# Patient Record
Sex: Female | Born: 1995 | Race: Black or African American | Hispanic: No | Marital: Single | Smoking: Former smoker
Health system: Southern US, Community
[De-identification: ages and names within clinical notes are randomized; demographics above are authoritative.]

## PROBLEM LIST (undated history)

## (undated) ENCOUNTER — Emergency Department (HOSPITAL_COMMUNITY): Admission: EM | Discharge status: Absent without leave | Payer: Medicaid Other

## (undated) DIAGNOSIS — I10 Essential (primary) hypertension: Secondary | ICD-10-CM

## (undated) HISTORY — PX: WISDOM TOOTH EXTRACTION: SHX21

---
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
## 2014-06-07 ENCOUNTER — Encounter (HOSPITAL_COMMUNITY): Payer: Self-pay | Admitting: Emergency Medicine

## 2014-06-07 ENCOUNTER — Emergency Department (HOSPITAL_COMMUNITY)
Admission: EM | Admit: 2014-06-07 | Discharge: 2014-06-07 | Disposition: A | Discharge status: Home / Self Care | Payer: Medicaid Other | Attending: Emergency Medicine | Admitting: Emergency Medicine

## 2014-06-07 DIAGNOSIS — R1011 Right upper quadrant pain: Secondary | ICD-10-CM | POA: Diagnosis not present

## 2014-06-07 DIAGNOSIS — R1013 Epigastric pain: Secondary | ICD-10-CM | POA: Diagnosis not present

## 2014-06-07 DIAGNOSIS — M79609 Pain in unspecified limb: Secondary | ICD-10-CM | POA: Diagnosis not present

## 2014-06-07 DIAGNOSIS — Z880 Allergy status to penicillin: Secondary | ICD-10-CM | POA: Diagnosis not present

## 2014-06-07 DIAGNOSIS — Z3202 Encounter for pregnancy test, result negative: Secondary | ICD-10-CM | POA: Insufficient documentation

## 2014-06-07 DIAGNOSIS — M549 Dorsalgia, unspecified: Secondary | ICD-10-CM | POA: Insufficient documentation

## 2014-06-07 LAB — URINALYSIS, ROUTINE W REFLEX MICROSCOPIC
BILIRUBIN URINE: NEGATIVE
Glucose, UA: NEGATIVE mg/dL
Ketones, ur: NEGATIVE mg/dL
Leukocytes, UA: NEGATIVE
Nitrite: NEGATIVE
PH: 6 (ref 5.0–8.0)
Protein, ur: NEGATIVE mg/dL
Specific Gravity, Urine: 1.021 (ref 1.005–1.030)
UROBILINOGEN UA: 0.2 mg/dL (ref 0.0–1.0)

## 2014-06-07 LAB — COMPREHENSIVE METABOLIC PANEL
ALT: 11 U/L (ref 0–35)
AST: 17 U/L (ref 0–37)
Albumin: 3.8 g/dL (ref 3.5–5.2)
Alkaline Phosphatase: 59 U/L (ref 39–117)
Anion gap: 13 (ref 5–15)
BUN: 11 mg/dL (ref 6–23)
CHLORIDE: 102 meq/L (ref 96–112)
CO2: 25 meq/L (ref 19–32)
CREATININE: 0.82 mg/dL (ref 0.50–1.10)
Calcium: 9.2 mg/dL (ref 8.4–10.5)
GFR calc Af Amer: >90 mL/min (ref >90)
GFR calc non Af Amer: >90 mL/min (ref >90)
Glucose, Bld: 92 mg/dL (ref 70–99)
Potassium: 3.6 mEq/L — ABNORMAL LOW (ref 3.7–5.3)
Sodium: 140 mEq/L (ref 137–147)
Total Protein: 7 g/dL (ref 6.0–8.3)

## 2014-06-07 LAB — CBC WITH DIFFERENTIAL/PLATELET
Basophils Absolute: 0 10*3/uL (ref 0.0–0.1)
Basophils Relative: 0 % (ref 0–1)
Eosinophils Absolute: 0.1 10*3/uL (ref 0.0–0.7)
Eosinophils Relative: 2 % (ref 0–5)
HEMATOCRIT: 36.7 % (ref 36.0–46.0)
HEMOGLOBIN: 12.2 g/dL (ref 12.0–15.0)
LYMPHS ABS: 2.4 10*3/uL (ref 0.7–4.0)
LYMPHS PCT: 34 % (ref 12–46)
MCH: 28.2 pg (ref 26.0–34.0)
MCHC: 33.2 g/dL (ref 30.0–36.0)
MCV: 84.8 fL (ref 78.0–100.0)
MONO ABS: 0.4 10*3/uL (ref 0.1–1.0)
Monocytes Relative: 5 % (ref 3–12)
Neutro Abs: 4.1 10*3/uL (ref 1.7–7.7)
Neutrophils Relative %: 59 % (ref 43–77)
Platelets: 286 10*3/uL (ref 150–400)
RBC: 4.33 MIL/uL (ref 3.87–5.11)
RDW: 12.4 % (ref 11.5–15.5)
WBC: 7 10*3/uL (ref 4.0–10.5)

## 2014-06-07 LAB — URINE MICROSCOPIC-ADD ON

## 2014-06-07 LAB — POC URINE PREG, ED: Preg Test, Ur: NEGATIVE

## 2014-06-07 LAB — LIPASE, BLOOD: LIPASE: 32 U/L (ref 11–59)

## 2014-06-07 MED ORDER — OMEPRAZOLE 20 MG PO CPDR: 20.0000 mg | DELAYED_RELEASE_CAPSULE | Freq: Every day | ORAL | Status: DC

## 2014-06-07 MED ORDER — GI COCKTAIL ~~LOC~~
30.0000 mL | Freq: Once | ORAL | Status: AC
Start: 2014-06-07 — End: 2014-06-07
  Administered 2014-06-07: 30 mL via ORAL
  Filled 2014-06-07: qty 30

## 2014-06-07 NOTE — ED Notes (Signed)
MD at bedside. 

## 2014-06-07 NOTE — ED Provider Notes (Signed)
CSN: [id number]     Arrival date & time 06/07/14  1649 History   First MD Initiated Contact with Patient 06/07/14 1914     Chief Complaint  Patient presents with  . Back Pain  . Arm Pain  . Leg Pain     Patient is a 18 y.o. female presenting with back pain, arm pain, leg pain, and abdominal pain. The history is provided by the patient.  Back Pain Associated symptoms: leg pain   Associated symptoms: no abdominal pain, no chest pain and no fever   Arm Pain Pertinent negatives include no abdominal pain, chest pain, chills, coughing, fever, nausea, rash or vomiting.  Leg Pain Associated symptoms: back pain   Associated symptoms: no fever   Abdominal Pain Pain location:  Epigastric Pain quality: stabbing   Pain radiates to:  Back Pain severity:  Moderate Onset quality:  Gradual Context: eating   Context: not alcohol use, not previous surgeries and not trauma   Associated symptoms: no chest pain, no chills, no cough, no fever, no nausea, no shortness of breath and no vomiting   Risk factors: no alcohol abuse, no aspirin use, has not had multiple surgeries, no NSAID use and not pregnant    Asymptomatic currently w/ exception of abd pain.      History reviewed. No pertinent past medical history. Past Surgical History  Procedure Laterality Date  . Wisdom tooth extraction     History reviewed. No pertinent family history. History  Substance Use Topics  . Smoking status: Never Smoker   . Smokeless tobacco: Not on file  . Alcohol Use: No   OB History   Grav Para Term Preterm Abortions TAB SAB Ect Mult Living                 Review of Systems  Constitutional: Negative for fever and chills.  Respiratory: Negative for cough, shortness of breath and wheezing.   Cardiovascular: Negative for chest pain.  Gastrointestinal: Negative for nausea, vomiting and abdominal pain.  Musculoskeletal: Positive for back pain.  Skin: Negative for rash.  All other systems reviewed and are  negative.     Allergies  Penicillins  Home Medications   Prior to Admission medications   Medication Sig Start Date End Date Taking? Authorizing Provider  ibuprofen (ADVIL,MOTRIN) 800 MG tablet Take 800 mg by mouth every 8 (eight) hours as needed for mild pain.   Yes Historical Provider, MD   BP 130/77  Pulse 89  Temp(Src) 98 F (36.7 C) (Oral)  Resp 12  Ht  (1.549 m)  SpO2 100% Physical Exam  Nursing note and vitals reviewed. Constitutional: She is oriented to person, place, and time. She appears well-developed and well-nourished. No distress.  HENT:  Head: Normocephalic and atraumatic.  Nose: Nose normal.  Mouth/Throat: Oropharynx is clear and moist. No oropharyngeal exudate.  Eyes: Conjunctivae are normal. Pupils are equal, round, and reactive to light. No scleral icterus.  Neck: Normal range of motion. Neck supple. No tracheal deviation present.  Cardiovascular: Normal rate, regular rhythm and normal heart sounds.   No murmur heard. Pulmonary/Chest: Effort normal and breath sounds normal. No respiratory distress. She has no wheezes. She has no rales. She exhibits no tenderness.  Abdominal: Soft. Bowel sounds are normal. She exhibits no distension and no mass. There is tenderness (epigastric and RUQ). There is no rebound and no guarding.  Neg Murphy's. Neg CVAT  Musculoskeletal: Normal range of motion. She exhibits no edema.  No edema, warmth,  or TTP of extremities  Neurological: She is alert and oriented to person, place, and time. No cranial nerve deficit. Coordination normal.  Normal strength and sensation. Reflexes equal 2+. Normal gait  Skin: Skin is warm and dry. No rash noted.  Psychiatric: She has a normal mood and affect.    ED Course  Procedures (including critical care time) Labs Review Labs Reviewed  COMPREHENSIVE METABOLIC PANEL - Abnormal; Notable for the following:    Potassium 3.6 (*)    Total Bilirubin <0.2 (*)    All other components within  normal limits  URINALYSIS, ROUTINE W REFLEX MICROSCOPIC - Abnormal; Notable for the following:    APPearance CLOUDY (*)    Hgb urine dipstick TRACE (*)    All other components within normal limits  URINE MICROSCOPIC-ADD ON - Abnormal; Notable for the following:    Squamous Epithelial / LPF MANY (*)    Bacteria, UA FEW (*)    All other components within normal limits  CBC WITH DIFFERENTIAL  LIPASE, BLOOD  POC URINE PREG, ED    Imaging Review No results found.   EKG Interpretation None      MDM   Final diagnoses:  Epigastric pain    Pt presents for multiple complaints and is pan positive on review of systems making. Pt notes intermittent pain to left side of back, arm and leg but has been asymptomatic recently. Etiology not clear. No obvious source of pain on exam. No signs of DVT or infections.   Pt presented to ED primarily for epigastric pain worse with meals.  Neg Murphy sign and pain is mostly epigastric not RUQ.  Abd soft NDNT, no signs of peritonitis or systemic toxicity to suggest surgical emergency. Suspect gastritis vs PUD vs GERD.  No bloody stool per pt.  Tolerates PO.  Gave GI cocktail.  Doubt pancreatitis. Pt is well appearing.   UA w./o infx markers. Preg neg,    8:35 PM abd pain improved after GI cocktail  Suspect GI source of pain. D/c with PPI.  Gave f/u contact info to arrange PCP appt.    Sofie Rower, MD 06/08/14 [phone]

## 2014-06-07 NOTE — ED Notes (Signed)
Pt A&Ox4, ambulatory at d/c with steady gait, NAD 

## 2014-06-07 NOTE — ED Notes (Signed)
Pt c/o left arm, leg and back pain intermittently x months; pt sts some numbness at times

## 2014-06-09 NOTE — ED Provider Notes (Signed)
I have personally seen and examined the patient.  I have discussed the plan of care with the resident.  I have reviewed the documentation on PMH/FH/Soc. History.  I have reviewed the documentation of the resident and agree.  Pt well appearing on my evaluation abd soft without any significant tenderness I feel she is safe/stable for d/c home and followup as outpatient   Joya Gaskins, MD 06/09/14 0025

## 2014-09-28 NOTE — L&D Delivery Note (Signed)
    Delivery Note At 3:53 AM a viable female was delivered via  (Presentation: left occiput anterior  ).  APGAR: 4, 9; weight 4 lb 2.3 oz (1880 g).   Placenta status: spontaneous, intact.  Cord: 3 vessels  with the following complications: none .  Cord pH: N/A  Anesthesia: Epidural  Episiotomy: None Lacerations: None Suture Repair: none Est. Blood Loss (mL): 100  Mom to postpartum.  Baby to Couplet care / Skin to Skin. Due to baby's small size, Peds having baby feed and will check CBG. If baby can't maintain blood sugar, baby to NICU.   De Hollingshead 04/23/2015, 4:24 AM  I was gloved and participated in the delivery Agree with note No difficulty with shoulders Baby was slow to cry after delivery, probably due to IUGR RNs were in contact with Peds MD re: management Aviva Signs, CNM

## 2014-10-22 ENCOUNTER — Other Ambulatory Visit: Payer: Self-pay | Admitting: Obstetrics and Gynecology

## 2014-10-22 DIAGNOSIS — O3680X0 Pregnancy with inconclusive fetal viability, not applicable or unspecified: Secondary | ICD-10-CM

## 2014-10-24 ENCOUNTER — Other Ambulatory Visit: Payer: Self-pay | Admitting: Obstetrics and Gynecology

## 2014-10-24 ENCOUNTER — Ambulatory Visit (INDEPENDENT_AMBULATORY_CARE_PROVIDER_SITE_OTHER): Payer: Medicaid Other

## 2014-10-24 DIAGNOSIS — O26841 Uterine size-date discrepancy, first trimester: Secondary | ICD-10-CM

## 2014-10-24 DIAGNOSIS — O3680X0 Pregnancy with inconclusive fetal viability, not applicable or unspecified: Secondary | ICD-10-CM

## 2014-10-24 NOTE — Progress Notes (Signed)
U/S-single IUP with +FCA noted, FHR-157 bpm, cx appears closed, anterior Gr 0 placenta, bilateral adnexa appears WNL, pt desires NT/IT screening, it will be scheduled with New OB appt

## 2014-10-30 ENCOUNTER — Other Ambulatory Visit: Payer: Self-pay | Admitting: Obstetrics and Gynecology

## 2014-10-30 DIAGNOSIS — Z3682 Encounter for antenatal screening for nuchal translucency: Secondary | ICD-10-CM

## 2014-11-06 ENCOUNTER — Ambulatory Visit (INDEPENDENT_AMBULATORY_CARE_PROVIDER_SITE_OTHER): Payer: Medicaid Other | Admitting: Women's Health

## 2014-11-06 ENCOUNTER — Other Ambulatory Visit: Payer: Medicaid Other

## 2014-11-06 ENCOUNTER — Ambulatory Visit (INDEPENDENT_AMBULATORY_CARE_PROVIDER_SITE_OTHER): Payer: Medicaid Other

## 2014-11-06 ENCOUNTER — Encounter: Payer: Self-pay | Admitting: Women's Health

## 2014-11-06 VITALS — BP 110/60 | Wt 175.0 lb

## 2014-11-06 DIAGNOSIS — Z0283 Encounter for blood-alcohol and blood-drug test: Secondary | ICD-10-CM

## 2014-11-06 DIAGNOSIS — Z130 Encounter for screening for diseases of the blood and blood-forming organs and certain disorders involving the immune mechanism: Secondary | ICD-10-CM

## 2014-11-06 DIAGNOSIS — Z1389 Encounter for screening for other disorder: Secondary | ICD-10-CM

## 2014-11-06 DIAGNOSIS — Z3400 Encounter for supervision of normal first pregnancy, unspecified trimester: Secondary | ICD-10-CM | POA: Insufficient documentation

## 2014-11-06 DIAGNOSIS — Z3401 Encounter for supervision of normal first pregnancy, first trimester: Secondary | ICD-10-CM

## 2014-11-06 DIAGNOSIS — Z3402 Encounter for supervision of normal first pregnancy, second trimester: Secondary | ICD-10-CM

## 2014-11-06 DIAGNOSIS — Z114 Encounter for screening for human immunodeficiency virus [HIV]: Secondary | ICD-10-CM

## 2014-11-06 DIAGNOSIS — Z118 Encounter for screening for other infectious and parasitic diseases: Secondary | ICD-10-CM

## 2014-11-06 DIAGNOSIS — Z3682 Encounter for antenatal screening for nuchal translucency: Secondary | ICD-10-CM

## 2014-11-06 DIAGNOSIS — Z0184 Encounter for antibody response examination: Secondary | ICD-10-CM

## 2014-11-06 DIAGNOSIS — Z113 Encounter for screening for infections with a predominantly sexual mode of transmission: Secondary | ICD-10-CM

## 2014-11-06 DIAGNOSIS — Z1371 Encounter for nonprocreative screening for genetic disease carrier status: Secondary | ICD-10-CM

## 2014-11-06 DIAGNOSIS — Z331 Pregnant state, incidental: Secondary | ICD-10-CM

## 2014-11-06 DIAGNOSIS — Z1159 Encounter for screening for other viral diseases: Secondary | ICD-10-CM

## 2014-11-06 DIAGNOSIS — Z36 Encounter for antenatal screening of mother: Secondary | ICD-10-CM

## 2014-11-06 LAB — POCT URINALYSIS DIPSTICK
Blood, UA: NEGATIVE
Glucose, UA: NEGATIVE
KETONES UA: NEGATIVE
Leukocytes, UA: NEGATIVE
Nitrite, UA: NEGATIVE
PROTEIN UA: NEGATIVE

## 2014-11-06 LAB — OB RESULTS CONSOLE ABO/RH: RH Type: POSITIVE

## 2014-11-06 LAB — OB RESULTS CONSOLE RUBELLA ANTIBODY, IGM: Rubella: IMMUNE

## 2014-11-06 NOTE — Progress Notes (Signed)
U/S(13+3wks)-active fetus, meas c/w dates, fluid WNL, anterior Gr 0 placenta, cx appears closed (3.3cm), bilateral adnexa appears WNL, NB present, NT-1.3459mm, FHr- 159 bpm

## 2014-11-06 NOTE — Progress Notes (Signed)
  Subjective:  Christina Ingram is a 19 y.o. G1P0 African American female at 4942w3d by 11wk u/s, being seen today for her first obstetrical visit.  Her obstetrical history is significant for primigravida.  She states she was born w/ arthritis, was taking an otc arthritis med but stopped when found out she was pregnant. Now eats bananas to help w/ her pain. Pregnancy history fully reviewed.  Patient reports no complaints. Denies vb, cramping, uti s/s, abnormal/malodorous vag d/c, or vulvovaginal itching/irritation.  BP 110/60 mmHg  Wt 175 lb (79.379 kg)  LMP 07/31/2014 (Approximate)  HISTORY: OB History  Gravida Para Term Preterm AB SAB TAB Ectopic Multiple Living  1             # Outcome Date GA Lbr Len/2nd Weight Sex Delivery Anes PTL Lv  1 Current              History reviewed. No pertinent past medical history. Past Surgical History  Procedure Laterality Date  . Wisdom tooth extraction     No family history on file.  Exam   System:     General: Well developed & nourished, no acute distress   Skin: Warm & dry, normal coloration and turgor, no rashes   Neurologic: Alert & oriented, normal mood   Cardiovascular: Regular rate & rhythm   Respiratory: Effort & rate normal, LCTAB, acyanotic   Abdomen: Soft, non tender   Extremities: normal strength, tone  Thin prep pap smear <21yo high risk HPV cotesting FHR: 159 via u/s   Assessment:   Pregnancy: G1P0 There are no active problems to display for this patient.   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   Plan:  Initial labs drawn Continue prenatal vitamins Problem list reviewed and updated Reviewed n/v relief measures and warning s/s to report Reviewed recommended weight gain based on pre-gravid BMI Encouraged well-balanced diet Genetic Screening discussed Integrated Screen: 1st IT/NT today Cystic fibrosis screening discussed requested Ultrasound discussed; fetal survey: requested Follow up in 4 weeks for  visit and 2nd IT CCNC completed NFPartnership offered, accepted, referral faxed  Had flu shot Dec @ [facility]  Christina DuncansBooker, Christina Ingram CNM, [facility]BC 11/06/2014 10:53 AM

## 2014-11-06 NOTE — Patient Instructions (Signed)

## 2014-11-07 LAB — SICKLE CELL SCREEN: SICKLE CELL SCREEN: NEGATIVE

## 2014-11-07 LAB — URINE CULTURE: Organism ID, Bacteria: NO GROWTH

## 2014-11-08 ENCOUNTER — Telehealth: Payer: Self-pay | Admitting: *Deleted

## 2014-11-08 DIAGNOSIS — Z3402 Encounter for supervision of normal first pregnancy, second trimester: Secondary | ICD-10-CM

## 2014-11-08 LAB — MATERNAL SCREEN, INTEGRATED #1
Crown Rump Length: 66.7 mm
Gest. Age on Collection Date: 12.9 weeks
Maternal Age at EDD: 21 years
NUCHAL TRANSLUCENCY (NT): 1.6 mm
NUMBER OF FETUSES: 1
PAPP-A VALUE: 705.8 ng/mL
WEIGHT: 175 [lb_av]

## 2014-11-08 LAB — GC/CHLAMYDIA PROBE AMP
Chlamydia trachomatis, NAA: NEGATIVE
NEISSERIA GONORRHOEAE BY PCR: NEGATIVE

## 2014-11-08 MED ORDER — PRENATAL PLUS 27-1 MG PO TABS: 1.0000 | ORAL_TABLET | Freq: Every day | ORAL | Status: DC

## 2014-11-08 NOTE — Telephone Encounter (Signed)
Pt called stating that she needed a refill on her prenatal vitamins. [facility] [place] I advised the pt to give the pharmacy till after lunch to receive the Rx and fill it. Pt verbalized understanding.

## 2014-11-12 LAB — PMP SCREEN PROFILE (10S), URINE
Amphetamine Screen, Ur: NEGATIVE ng/mL
BENZODIAZEPINE SCREEN, URINE: NEGATIVE ng/mL
Barbiturate Screen, Ur: NEGATIVE ng/mL
Cannabinoids Ur Ql Scn: NEGATIVE ng/mL
Cocaine(Metab.)Screen, Urine: NEGATIVE ng/mL
Creatinine(Crt), U: 198.5 mg/dL (ref 20.0–300.0)
Methadone Scn, Ur: NEGATIVE ng/mL
OXYCODONE+OXYMORPHONE UR QL SCN: NEGATIVE ng/mL
Opiate Scrn, Ur: NEGATIVE ng/mL
PCP Scrn, Ur: NEGATIVE ng/mL
PROPOXYPHENE SCREEN: NEGATIVE ng/mL
Ph of Urine: 5.9 (ref 4.5–8.9)

## 2014-11-12 LAB — CBC
HCT: 37.1 % (ref 34.0–46.6)
Hemoglobin: 12.6 g/dL (ref 11.1–15.9)
MCH: 29 pg (ref 26.6–33.0)
MCHC: 34 g/dL (ref 31.5–35.7)
MCV: 86 fL (ref 79–97)
Platelets: 247 10*3/uL (ref 150–379)
RBC: 4.34 x10E6/uL (ref 3.77–5.28)
RDW: 13.7 % (ref 12.3–15.4)
WBC: 7.1 10*3/uL (ref 3.4–10.8)

## 2014-11-12 LAB — HEPATITIS B SURFACE ANTIGEN: Hepatitis B Surface Ag: NEGATIVE

## 2014-11-12 LAB — URINALYSIS, ROUTINE W REFLEX MICROSCOPIC
BILIRUBIN UA: NEGATIVE
GLUCOSE, UA: NEGATIVE
KETONES UA: NEGATIVE
Leukocytes, UA: NEGATIVE
Nitrite, UA: NEGATIVE
PH UA: 6 (ref 5.0–7.5)
PROTEIN UA: NEGATIVE
RBC, UA: NEGATIVE
SPEC GRAV UA: 1.027 (ref 1.005–1.030)
Urobilinogen, Ur: 0.2 mg/dL (ref 0.2–1.0)

## 2014-11-12 LAB — CYSTIC FIBROSIS MUTATION 97: GENE DIS ANAL CARRIER INTERP BLD/T-IMP: NOT DETECTED

## 2014-11-12 LAB — RPR: RPR Ser Ql: NONREACTIVE

## 2014-11-12 LAB — ANTIBODY SCREEN: ANTIBODY SCREEN: NEGATIVE

## 2014-11-12 LAB — ABO/RH: Rh Factor: POSITIVE

## 2014-11-12 LAB — RUBELLA SCREEN: RUBELLA: 4.71 {index} (ref >0.99)

## 2014-11-12 LAB — VARICELLA ZOSTER ANTIBODY, IGG: VARICELLA: 3053 {index} (ref >165)

## 2014-11-12 LAB — HIV ANTIBODY (ROUTINE TESTING W REFLEX): HIV SCREEN 4TH GENERATION: NONREACTIVE

## 2014-12-04 ENCOUNTER — Ambulatory Visit (INDEPENDENT_AMBULATORY_CARE_PROVIDER_SITE_OTHER): Payer: Medicaid Other | Admitting: Women's Health

## 2014-12-04 ENCOUNTER — Encounter: Payer: Self-pay | Admitting: Women's Health

## 2014-12-04 VITALS — BP 116/66 | HR 72 | Wt 174.0 lb

## 2014-12-04 DIAGNOSIS — Z3682 Encounter for antenatal screening for nuchal translucency: Secondary | ICD-10-CM

## 2014-12-04 DIAGNOSIS — Z363 Encounter for antenatal screening for malformations: Secondary | ICD-10-CM

## 2014-12-04 DIAGNOSIS — Z3402 Encounter for supervision of normal first pregnancy, second trimester: Secondary | ICD-10-CM

## 2014-12-04 DIAGNOSIS — Z331 Pregnant state, incidental: Secondary | ICD-10-CM

## 2014-12-04 DIAGNOSIS — Z1389 Encounter for screening for other disorder: Secondary | ICD-10-CM

## 2014-12-04 LAB — POCT URINALYSIS DIPSTICK
Blood, UA: NEGATIVE
Glucose, UA: NEGATIVE
KETONES UA: NEGATIVE
LEUKOCYTES UA: NEGATIVE
Nitrite, UA: NEGATIVE
PROTEIN UA: NEGATIVE

## 2014-12-04 NOTE — Patient Instructions (Signed)
Second Trimester of Pregnancy The second trimester is from week 13 through week 28, months 4 through 6. The second trimester is often a time when you feel your best. Your body has also adjusted to being pregnant, and you begin to feel better physically. Usually, morning sickness has lessened or quit completely, you may have more energy, and you may have an increase in appetite. The second trimester is also a time when the fetus is growing rapidly. At the end of the sixth month, the fetus is about 9 inches long and weighs about 1 pounds. You will likely begin to feel the baby move (quickening) between 18 and 20 weeks of the pregnancy. BODY CHANGES Your body goes through many changes during pregnancy. The changes vary from woman to woman.   Your weight will continue to increase. You will notice your lower abdomen bulging out.  You may begin to get stretch marks on your hips, abdomen, and breasts.  You may develop headaches that can be relieved by medicines approved by your health care provider.  You may urinate more often because the fetus is pressing on your bladder.  You may develop or continue to have heartburn as a result of your pregnancy.  You may develop constipation because certain hormones are causing the muscles that push waste through your intestines to slow down.  You may develop hemorrhoids or swollen, bulging veins (varicose veins).  You may have back pain because of the weight gain and pregnancy hormones relaxing your joints between the bones in your pelvis and as a result of a shift in weight and the muscles that support your balance.  Your breasts will continue to grow and be tender.  Your gums may bleed and may be sensitive to brushing and flossing.  Dark spots or blotches (chloasma, mask of pregnancy) may develop on your face. This will likely fade after the baby is born.  A dark line from your belly button to the pubic area (linea nigra) may appear. This will likely fade  after the baby is born.  You may have changes in your hair. These can include thickening of your hair, rapid growth, and changes in texture. Some women also have hair loss during or after pregnancy, or hair that feels dry or thin. Your hair will most likely return to normal after your baby is born. WHAT TO EXPECT AT YOUR PRENATAL VISITS During a routine prenatal visit:  You will be weighed to make sure you and the fetus are growing normally.  Your blood pressure will be taken.  Your abdomen will be measured to track your baby's growth.  The fetal heartbeat will be listened to.  Any test results from the previous visit will be discussed. Your health care provider may ask you:  How you are feeling.  If you are feeling the baby move.  If you have had any abnormal symptoms, such as leaking fluid, bleeding, severe headaches, or abdominal cramping.  If you have any questions. Other tests that may be performed during your second trimester include:  Blood tests that check for:  Low iron levels (anemia).  Gestational diabetes (between 24 and 28 weeks).  Rh antibodies.  Urine tests to check for infections, diabetes, or protein in the urine.  An ultrasound to confirm the proper growth and development of the baby.  An amniocentesis to check for possible genetic problems.  Fetal screens for spina bifida and Down syndrome. HOME CARE INSTRUCTIONS   Avoid all smoking, herbs, alcohol, and unprescribed   drugs. These chemicals affect the formation and growth of the baby.  Follow your health care provider's instructions regarding medicine use. There are medicines that are either safe or unsafe to take during pregnancy.  Exercise only as directed by your health care provider. Experiencing uterine cramps is a good sign to stop exercising.  Continue to eat regular, healthy meals.  Wear a good support bra for breast tenderness.  Do not use hot tubs, steam rooms, or saunas.  Wear your  seat belt at all times when driving.  Avoid raw meat, uncooked cheese, cat litter boxes, and soil used by cats. These carry germs that can cause birth defects in the baby.  Take your prenatal vitamins.  Try taking a stool softener (if your health care provider approves) if you develop constipation. Eat more high-fiber foods, such as fresh vegetables or fruit and whole grains. Drink plenty of fluids to keep your urine clear or pale yellow.  Take warm sitz baths to soothe any pain or discomfort caused by hemorrhoids. Use hemorrhoid cream if your health care provider approves.  If you develop varicose veins, wear support hose. Elevate your feet for 15 minutes, 3-4 times a day. Limit salt in your diet.  Avoid heavy lifting, wear low heel shoes, and practice good posture.  Rest with your legs elevated if you have leg cramps or low back pain.  Visit your dentist if you have not gone yet during your pregnancy. Use a soft toothbrush to brush your teeth and be gentle when you floss.  A sexual relationship may be continued unless your health care provider directs you otherwise.  Continue to go to all your prenatal visits as directed by your health care provider. SEEK MEDICAL CARE IF:   You have dizziness.  You have mild pelvic cramps, pelvic pressure, or nagging pain in the abdominal area.  You have persistent nausea, vomiting, or diarrhea.  You have a bad smelling vaginal discharge.  You have pain with urination. SEEK IMMEDIATE MEDICAL CARE IF:   You have a fever.  You are leaking fluid from your vagina.  You have spotting or bleeding from your vagina.  You have severe abdominal cramping or pain.  You have rapid weight gain or loss.  You have shortness of breath with chest pain.  You notice sudden or extreme swelling of your face, hands, ankles, feet, or legs.  You have not felt your baby move in over an hour.  You have severe headaches that do not go away with  medicine.  You have vision changes. Document Released: 09/08/2001 Document Revised: 09/19/2013 Document Reviewed: 11/15/2012 [employer] Patient Information 2015 [employer]. This information is not intended to replace advice given to you by your health care provider. Make sure you discuss any questions you have with your health care provider.  

## 2014-12-04 NOTE — Progress Notes (Signed)
Low-risk OB appointment G1P0 668w3d Estimated Date of Delivery: 05/11/15 BP 116/66 mmHg  Pulse 72  Wt 174 lb (78.926 kg)  LMP 07/31/2014 (Approximate)  BP, weight reviewed.  Unable to void, will try again before she leaves. Refer to obstetrical flow sheet for FH & FHR.  Some fm. Denies cramping, lof, vb, or uti s/s. No complaints. Reviewed warning s/s to report, fm. Plan:  Continue routine obstetrical care  F/U in 3wks for OB appointment and anatomy u/s. 2nd IT today

## 2014-12-06 LAB — MATERNAL SCREEN, INTEGRATED #2
AFP MoM: 1.19
Alpha-Fetoprotein: 41.5 ng/mL
Crown Rump Length: 66.7 mm
DIA MOM: 2.62
DIA Value: 428 pg/mL
Estriol, Unconjugated: 0.53 ng/mL
GEST. AGE ON COLLECTION DATE: 12.9 wk
GESTATIONAL AGE: 16.9 wk
MATERNAL AGE AT EDD: 21 a
NUCHAL TRANSLUCENCY (NT): 1.6 mm
NUCHAL TRANSLUCENCY MOM: 1.12
NUMBER OF FETUSES: 1
PAPP-A MOM: 0.74
PAPP-A VALUE: 705.8 ng/mL
TEST RESULTS: NEGATIVE
WEIGHT: 175 [lb_av]
Weight: 174 [lb_av]
hCG MoM: 1.74
hCG Value: 46.1 IU/mL
uE3 MoM: 0.54

## 2014-12-25 ENCOUNTER — Ambulatory Visit (INDEPENDENT_AMBULATORY_CARE_PROVIDER_SITE_OTHER): Payer: Medicaid Other

## 2014-12-25 ENCOUNTER — Encounter: Payer: Self-pay | Admitting: Obstetrics & Gynecology

## 2014-12-25 ENCOUNTER — Ambulatory Visit (INDEPENDENT_AMBULATORY_CARE_PROVIDER_SITE_OTHER): Payer: Medicaid Other | Admitting: Obstetrics & Gynecology

## 2014-12-25 VITALS — BP 120/80 | HR 80 | Wt 176.0 lb

## 2014-12-25 DIAGNOSIS — Z1389 Encounter for screening for other disorder: Secondary | ICD-10-CM

## 2014-12-25 DIAGNOSIS — Z36 Encounter for antenatal screening of mother: Secondary | ICD-10-CM | POA: Diagnosis not present

## 2014-12-25 DIAGNOSIS — Z3402 Encounter for supervision of normal first pregnancy, second trimester: Secondary | ICD-10-CM

## 2014-12-25 DIAGNOSIS — Z331 Pregnant state, incidental: Secondary | ICD-10-CM

## 2014-12-25 DIAGNOSIS — Z363 Encounter for antenatal screening for malformations: Secondary | ICD-10-CM

## 2014-12-25 NOTE — Progress Notes (Signed)
US 6239w3d ,measurements c/w dates,cx appears closed 4.1cm,afi wnl,ant fundal pl,breech pos,fht 159,normal ov's, **limited view of heart,pleases schedule for f/u**

## 2014-12-25 NOTE — Progress Notes (Signed)
Sonogram is normal sub optimal views of OFT: repeat at 28 weeks  G1P0 127w3d Estimated Date of Delivery: 05/11/15  Weight 176 lb (79.833 kg), last menstrual period 07/31/2014.   BP weight and urine results all reviewed and noted.  Please refer to the obstetrical flow sheet for the fundal height and fetal heart rate documentation:  Patient reports good fetal movement, denies any bleeding and no rupture of membranes symptoms or regular contractions. Patient is without complaints. All questions were answered.  Plan:  Continued routine obstetrical care,   Follow up in 4 weeks for OB appointment,

## 2014-12-26 ENCOUNTER — Ambulatory Visit (INDEPENDENT_AMBULATORY_CARE_PROVIDER_SITE_OTHER): Payer: Medicaid Other | Admitting: Obstetrics & Gynecology

## 2014-12-26 VITALS — BP 132/80 | HR 80 | Temp 98.1°F | Wt 178.0 lb

## 2014-12-26 DIAGNOSIS — J020 Streptococcal pharyngitis: Secondary | ICD-10-CM

## 2014-12-26 DIAGNOSIS — Z1389 Encounter for screening for other disorder: Secondary | ICD-10-CM

## 2014-12-26 DIAGNOSIS — Z331 Pregnant state, incidental: Secondary | ICD-10-CM

## 2014-12-26 LAB — POCT URINALYSIS DIPSTICK
GLUCOSE UA: NEGATIVE
Glucose, UA: NEGATIVE
Ketones, UA: NEGATIVE
LEUKOCYTES UA: NEGATIVE
NITRITE UA: NEGATIVE
Protein, UA: NEGATIVE
Protein, UA: NEGATIVE

## 2014-12-26 NOTE — Progress Notes (Signed)
[employer]  Pt complaining of sore throat for 1 day, dysphagia, hurts to talk no fever chills Stuffy cough  Blood pressure 132/80, pulse 80, temperature 98.1 F (36.7 C), weight 178 lb (80.74 kg), last menstrual period 07/31/2014. Bilateral lymphadenopathy tender, bilateral tonsillar microabscesses No sinus tenderness  Strep Pharyngitis  Keflex 500 mg QID x 10 days

## 2015-01-22 ENCOUNTER — Encounter: Payer: Self-pay | Admitting: Women's Health

## 2015-01-22 ENCOUNTER — Ambulatory Visit (INDEPENDENT_AMBULATORY_CARE_PROVIDER_SITE_OTHER): Payer: Medicaid Other | Admitting: Women's Health

## 2015-01-22 VITALS — BP 114/70 | HR 80 | Wt 180.0 lb

## 2015-01-22 DIAGNOSIS — Z3402 Encounter for supervision of normal first pregnancy, second trimester: Secondary | ICD-10-CM

## 2015-01-22 DIAGNOSIS — Z363 Encounter for antenatal screening for malformations: Secondary | ICD-10-CM

## 2015-01-22 DIAGNOSIS — Z1389 Encounter for screening for other disorder: Secondary | ICD-10-CM

## 2015-01-22 DIAGNOSIS — Z331 Pregnant state, incidental: Secondary | ICD-10-CM

## 2015-01-22 LAB — POCT URINALYSIS DIPSTICK
Blood, UA: NEGATIVE
GLUCOSE UA: NEGATIVE
Ketones, UA: NEGATIVE
LEUKOCYTES UA: NEGATIVE
NITRITE UA: NEGATIVE
Protein, UA: NEGATIVE

## 2015-01-22 NOTE — Patient Instructions (Addendum)
You will have your sugar test next visit.  Please do not eat or drink anything after midnight the night before you come, not even water.  You will be here for at least two hours.     Call the office [phone]) or go to [facility] if:  You begin to have strong, frequent contractions  Your water breaks.  Sometimes it is a big gush of fluid, sometimes it is just a trickle that keeps getting your panties wet or running down your legs  You have vaginal bleeding.  It is normal to have a small amount of spotting if your cervix was checked.   You don't feel your baby moving like normal.  If you don't, get you something to eat and drink and lay down and focus on feeling your baby move.   If your baby is still not moving like normal, you should call the office or go to [facility].  Constipation  Drink plenty of fluid, preferably water, throughout the day  Eat foods high in fiber such as fruits, vegetables, and grains  Exercise, such as walking, is a good way to keep your bowels regular  Drink warm fluids, especially warm prune juice, or decaf coffee  Eat a 1/2 cup of real oatmeal (not instant), 1/2 cup applesauce, and 1/2-1 cup warm prune juice every day  If needed, you may take Colace (docusate sodium) stool softener once or twice a day to help keep the stool soft. If you are pregnant, wait until you are out of your first trimester (12-14 weeks of pregnancy)  If you still are having problems with constipation, you may take Miralax once daily as needed to help keep your bowels regular.  If you are pregnant, wait until you are out of your first trimester (12-14 weeks of pregnancy)    Second Trimester of Pregnancy The second trimester is from week 13 through week 28, months 4 through 6. The second trimester is often a time when you feel your best. Your body has also adjusted to being pregnant, and you begin to feel better physically. Usually, morning sickness has lessened or quit  completely, you may have more energy, and you may have an increase in appetite. The second trimester is also a time when the fetus is growing rapidly. At the end of the sixth month, the fetus is about 9 inches long and weighs about 1 pounds. You will likely begin to feel the baby move (quickening) between 18 and 20 weeks of the pregnancy. BODY CHANGES Your body goes through many changes during pregnancy. The changes vary from woman to woman.  12. Your weight will continue to increase. You will notice your lower abdomen bulging out. 13. You may begin to get stretch marks on your hips, abdomen, and breasts. 14. You may develop headaches that can be relieved by medicines approved by your health care provider. 15. You may urinate more often because the fetus is pressing on your bladder. 16. You may develop or continue to have heartburn as a result of your pregnancy. 17. You may develop constipation because certain hormones are causing the muscles that push waste through your intestines to slow down. 18. You may develop hemorrhoids or swollen, bulging veins (varicose veins). 19. You may have back pain because of the weight gain and pregnancy hormones relaxing your joints between the bones in your pelvis and as a result of a shift in weight and the muscles that support your balance. 20. Your breasts will continue  to grow and be tender. 21. Your gums may bleed and may be sensitive to brushing and flossing. 22. Dark spots or blotches (chloasma, mask of pregnancy) may develop on your face. This will likely fade after the baby is born. 23. A dark line from your belly button to the pubic area (linea nigra) may appear. This will likely fade after the baby is born. 24. You may have changes in your hair. These can include thickening of your hair, rapid growth, and changes in texture. Some women also have hair loss during or after pregnancy, or hair that feels dry or thin. Your hair will most likely return to  normal after your baby is born. WHAT TO EXPECT AT YOUR PRENATAL VISITS During a routine prenatal visit:  You will be weighed to make sure you and the fetus are growing normally.  Your blood pressure will be taken.  Your abdomen will be measured to track your baby's growth.  The fetal heartbeat will be listened to.  Any test results from the previous visit will be discussed. Your health care provider may ask you:  How you are feeling.  If you are feeling the baby move.  If you have had any abnormal symptoms, such as leaking fluid, bleeding, severe headaches, or abdominal cramping.  If you have any questions. Other tests that may be performed during your second trimester include:  Blood tests that check for:  Low iron levels (anemia).  Gestational diabetes (between 24 and 28 weeks).  Rh antibodies.  Urine tests to check for infections, diabetes, or protein in the urine.  An ultrasound to confirm the proper growth and development of the baby.  An amniocentesis to check for possible genetic problems.  Fetal screens for spina bifida and Down syndrome. HOME CARE INSTRUCTIONS   Avoid all smoking, herbs, alcohol, and unprescribed drugs. These chemicals affect the formation and growth of the baby.  Follow your health care provider's instructions regarding medicine use. There are medicines that are either safe or unsafe to take during pregnancy.  Exercise only as directed by your health care provider. Experiencing uterine cramps is a good sign to stop exercising.  Continue to eat regular, healthy meals.  Wear a good support bra for breast tenderness.  Do not use hot tubs, steam rooms, or saunas.  Wear your seat belt at all times when driving.  Avoid raw meat, uncooked cheese, cat litter boxes, and soil used by cats. These carry germs that can cause birth defects in the baby.  Take your prenatal vitamins.  Try taking a stool softener (if your health care provider  approves) if you develop constipation. Eat more high-fiber foods, such as fresh vegetables or fruit and whole grains. Drink plenty of fluids to keep your urine clear or pale yellow.  Take warm sitz baths to soothe any pain or discomfort caused by hemorrhoids. Use hemorrhoid cream if your health care provider approves.  If you develop varicose veins, wear support hose. Elevate your feet for 15 minutes, 3-4 times a day. Limit salt in your diet.  Avoid heavy lifting, wear low heel shoes, and practice good posture.  Rest with your legs elevated if you have leg cramps or low back pain.  Visit your dentist if you have not gone yet during your pregnancy. Use a soft toothbrush to brush your teeth and be gentle when you floss.  A sexual relationship may be continued unless your health care provider directs you otherwise.  Continue to go to all your  prenatal visits as directed by your health care provider. SEEK MEDICAL CARE IF:   You have dizziness.  You have mild pelvic cramps, pelvic pressure, or nagging pain in the abdominal area.  You have persistent nausea, vomiting, or diarrhea.  You have a bad smelling vaginal discharge.  You have pain with urination. SEEK IMMEDIATE MEDICAL CARE IF:   You have a fever.  You are leaking fluid from your vagina.  You have spotting or bleeding from your vagina.  You have severe abdominal cramping or pain.  You have rapid weight gain or loss.  You have shortness of breath with chest pain.  You notice sudden or extreme swelling of your face, hands, ankles, feet, or legs.  You have not felt your baby move in over an hour.  You have severe headaches that do not go away with medicine.  You have vision changes. Document Released: 09/08/2001 Document Revised: 09/19/2013 Document Reviewed: 11/15/2012 [facility] Patient Information 2015 [place], [place]. This information is not intended to replace advice given to you by your health care provider. Make  sure you discuss any questions you have with your health care provider.

## 2015-01-22 NOTE — Progress Notes (Signed)
Low-risk OB appointment G1P0 7672w3d Estimated Date of Delivery: 05/11/15 BP 114/70 mmHg  Pulse 80  Wt 180 lb (81.647 kg)  LMP 07/31/2014 (Approximate)  BP, weight, and urine reviewed.  Refer to obstetrical flow sheet for FH & FHR.  Reports good fm.  Denies regular uc's, lof, vb, or uti s/s. Saw black spots yesterday during bm. Has been constipated/straining. Sounds like vagal response.  Reviewed ptl s/s, fm. Gave constipation info.  Plan:  Continue routine obstetrical care  F/U in 4wks for OB appointment, pn2, and f/u anatomy u/s to recheck OFTs

## 2015-02-19 ENCOUNTER — Ambulatory Visit (INDEPENDENT_AMBULATORY_CARE_PROVIDER_SITE_OTHER): Payer: Medicaid Other | Admitting: Women's Health

## 2015-02-19 ENCOUNTER — Other Ambulatory Visit: Payer: Self-pay | Admitting: Women's Health

## 2015-02-19 ENCOUNTER — Encounter: Payer: Medicaid Other | Admitting: Women's Health

## 2015-02-19 ENCOUNTER — Encounter: Payer: Self-pay | Admitting: Women's Health

## 2015-02-19 ENCOUNTER — Other Ambulatory Visit: Payer: Medicaid Other

## 2015-02-19 ENCOUNTER — Ambulatory Visit (INDEPENDENT_AMBULATORY_CARE_PROVIDER_SITE_OTHER): Payer: Medicaid Other

## 2015-02-19 VITALS — BP 118/74 | HR 64 | Wt 184.0 lb

## 2015-02-19 DIAGNOSIS — Z1389 Encounter for screening for other disorder: Secondary | ICD-10-CM

## 2015-02-19 DIAGNOSIS — Z3403 Encounter for supervision of normal first pregnancy, third trimester: Secondary | ICD-10-CM

## 2015-02-19 DIAGNOSIS — O283 Abnormal ultrasonic finding on antenatal screening of mother: Secondary | ICD-10-CM

## 2015-02-19 DIAGNOSIS — Z131 Encounter for screening for diabetes mellitus: Secondary | ICD-10-CM

## 2015-02-19 DIAGNOSIS — Z363 Encounter for antenatal screening for malformations: Secondary | ICD-10-CM

## 2015-02-19 DIAGNOSIS — O26843 Uterine size-date discrepancy, third trimester: Secondary | ICD-10-CM

## 2015-02-19 DIAGNOSIS — Z0373 Encounter for suspected fetal anomaly ruled out: Secondary | ICD-10-CM | POA: Diagnosis not present

## 2015-02-19 DIAGNOSIS — IMO0002 Reserved for concepts with insufficient information to code with codable children: Secondary | ICD-10-CM

## 2015-02-19 DIAGNOSIS — O365931 Maternal care for other known or suspected poor fetal growth, third trimester, fetus 1: Secondary | ICD-10-CM

## 2015-02-19 DIAGNOSIS — Z369 Encounter for antenatal screening, unspecified: Secondary | ICD-10-CM

## 2015-02-19 DIAGNOSIS — Z3402 Encounter for supervision of normal first pregnancy, second trimester: Secondary | ICD-10-CM

## 2015-02-19 DIAGNOSIS — Z331 Pregnant state, incidental: Secondary | ICD-10-CM

## 2015-02-19 LAB — POCT URINALYSIS DIPSTICK
Blood, UA: NEGATIVE
Glucose, UA: NEGATIVE
Ketones, UA: NEGATIVE
Leukocytes, UA: NEGATIVE
Nitrite, UA: NEGATIVE
Protein, UA: NEGATIVE

## 2015-02-19 LAB — OB RESULTS CONSOLE HIV ANTIBODY (ROUTINE TESTING): HIV: NONREACTIVE
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
## 2015-02-19 NOTE — Progress Notes (Signed)
US 28+3WKS, EFW 1001G  27%,ABD is  measuring small 25+2wks,BPP 28+1,HC 27+2WKS, cephalic,ant pl gr2, cardiac echogenic focus in lt vent 2.235mm,outflow tracks and 4ch were visualized w no obvious abn,afi 11.6cm,fht 144bpm

## 2015-02-19 NOTE — Patient Instructions (Addendum)
Call the office ([phone]) or go to [facility] if:  You begin to have strong, frequent contractions  Your water breaks.  Sometimes it is a big gush of fluid, sometimes it is just a trickle that keeps getting your panties wet or running down your legs  You have vaginal bleeding.  It is normal to have a small amount of spotting if your cervix was checked.   You don't feel your baby moving like normal.  If you don't, get you something to eat and drink and lay down and focus on feeling your baby move.  You should feel at least 10 movements in 2 hours.  If you don't, you should call the office or go to [facility].    Tdap Vaccine  It is recommended that you get the Tdap vaccine during the third trimester of EACH pregnancy to help protect your baby from getting pertussis (whooping cough)  27-36 weeks is the BEST time to do this so that you can pass the protection on to your baby. During pregnancy is better than after pregnancy, but if you are unable to get it during pregnancy it will be offered at the hospital.   You can get this vaccine at the health department or your family doctor  Everyone who will be around your baby should also be up-to-date on their vaccines. Adults (who are not pregnant) only need 1 dose of Tdap during adulthood.   [place] Pediatricians/Family Doctors:  [facility] [phone]            [facility] [phone]                 [facility] [phone] (usually not accepting new patients unless you have family there already, you are always welcome to call and ask)            [facility] ([address] [place]) [phone]   [place] Pediatricians/Family Doctors:   [facility]: [phone]  [facility]: [phone]    Third Trimester of Pregnancy The third trimester is from week 29 through week 42, months 7 through 9. The third trimester is a time when the  fetus is growing rapidly. At the end of the ninth month, the fetus is about 20 inches in length and weighs 6-10 pounds.  BODY CHANGES Your body goes through many changes during pregnancy. The changes vary from woman to woman.  9. Your weight will continue to increase. You can expect to gain 25-35 pounds (11-16 kg) by the end of the pregnancy. 10. You may begin to get stretch marks on your hips, abdomen, and breasts. 11. You may urinate more often because the fetus is moving lower into your pelvis and pressing on your bladder. 12. You may develop or continue to have heartburn as a result of your pregnancy. 13. You may develop constipation because certain hormones are causing the muscles that push waste through your intestines to slow down. 14. You may develop hemorrhoids or swollen, bulging veins (varicose veins). 15. You may have pelvic pain because of the weight gain and pregnancy hormones relaxing your joints between the bones in your pelvis. Backaches may result from overexertion of the muscles supporting your posture. 16. You may have changes in your hair. These can include thickening of your hair, rapid growth, and changes in texture. Some women also have hair loss during or after pregnancy, or hair that feels dry or thin. Your hair will most likely return to normal after   your baby is born. 17. Your breasts will continue to grow and be tender. A yellow discharge may leak from your breasts called colostrum. 18. Your belly button may stick out. 19. You may feel short of breath because of your expanding uterus. 20. You may notice the fetus "dropping," or moving lower in your abdomen. 21. You may have a bloody mucus discharge. This usually occurs a few days to a week before labor begins. 22. Your cervix becomes thin and soft (effaced) near your due date. WHAT TO EXPECT AT YOUR PRENATAL EXAMS  You will have prenatal exams every 2 weeks until week 36. Then, you will have weekly prenatal exams.  During a routine prenatal visit: 3. You will be weighed to make sure you and the fetus are growing normally. 4. Your blood pressure is taken. 5. Your abdomen will be measured to track your baby's growth. 6. The fetal heartbeat will be listened to. 7. Any test results from the previous visit will be discussed. 8. You may have a cervical check near your due date to see if you have effaced. At around 36 weeks, your caregiver will check your cervix. At the same time, your caregiver will also perform a test on the secretions of the vaginal tissue. This test is to determine if a type of bacteria, Group B streptococcus, is present. Your caregiver will explain this further. Your caregiver may ask you: 2. What your birth plan is. 3. How you are feeling. 4. If you are feeling the baby move. 5. If you have had any abnormal symptoms, such as leaking fluid, bleeding, severe headaches, or abdominal cramping. 6. If you have any questions. Other tests or screenings that may be performed during your third trimester include: 2. Blood tests that check for low iron levels (anemia). 3. Fetal testing to check the health, activity level, and growth of the fetus. Testing is done if you have certain medical conditions or if there are problems during the pregnancy. FALSE LABOR You may feel small, irregular contractions that eventually go away. These are called Braxton Hicks contractions, or false labor. Contractions may last for hours, days, or even weeks before true labor sets in. If contractions come at regular intervals, intensify, or become painful, it is best to be seen by your caregiver.  SIGNS OF LABOR  3. Menstrual-like cramps. 4. Contractions that are 5 minutes apart or less. 5. Contractions that start on the top of the uterus and spread down to the lower abdomen and back. 6. A sense of increased pelvic pressure or back pain. 7. A watery or bloody mucus discharge that comes from the vagina. If you have any  of these signs before the 37th week of pregnancy, call your caregiver right away. You need to go to the hospital to get checked immediately. HOME CARE INSTRUCTIONS   Avoid all smoking, herbs, alcohol, and unprescribed drugs. These chemicals affect the formation and growth of the baby.  Follow your caregiver's instructions regarding medicine use. There are medicines that are either safe or unsafe to take during pregnancy.  Exercise only as directed by your caregiver. Experiencing uterine cramps is a good sign to stop exercising.  Continue to eat regular, healthy meals.  Wear a good support bra for breast tenderness.  Do not use hot tubs, steam rooms, or saunas.  Wear your seat belt at all times when driving.  Avoid raw meat, uncooked cheese, cat litter boxes, and soil used by cats. These carry germs that can cause birth   defects in the baby.  Take your prenatal vitamins.  Try taking a stool softener (if your caregiver approves) if you develop constipation. Eat more high-fiber foods, such as fresh vegetables or fruit and whole grains. Drink plenty of fluids to keep your urine clear or pale yellow.  Take warm sitz baths to soothe any pain or discomfort caused by hemorrhoids. Use hemorrhoid cream if your caregiver approves.  If you develop varicose veins, wear support hose. Elevate your feet for 15 minutes, 3-4 times a day. Limit salt in your diet.  Avoid heavy lifting, wear low heal shoes, and practice good posture.  Rest a lot with your legs elevated if you have leg cramps or low back pain.  Visit your dentist if you have not gone during your pregnancy. Use a soft toothbrush to brush your teeth and be gentle when you floss.  A sexual relationship may be continued unless your caregiver directs you otherwise.  Do not travel far distances unless it is absolutely necessary and only with the approval of your caregiver.  Take prenatal classes to understand, practice, and ask questions  about the labor and delivery.  Make a trial run to the hospital.  Pack your hospital bag.  Prepare the baby's nursery.  Continue to go to all your prenatal visits as directed by your caregiver. SEEK MEDICAL CARE IF:  You are unsure if you are in labor or if your water has broken.  You have dizziness.  You have mild pelvic cramps, pelvic pressure, or nagging pain in your abdominal area.  You have persistent nausea, vomiting, or diarrhea.  You have a bad smelling vaginal discharge.  You have pain with urination. SEEK IMMEDIATE MEDICAL CARE IF:   You have a fever.  You are leaking fluid from your vagina.  You have spotting or bleeding from your vagina.  You have severe abdominal cramping or pain.  You have rapid weight loss or gain.  You have shortness of breath with chest pain.  You notice sudden or extreme swelling of your face, hands, ankles, feet, or legs.  You have not felt your baby move in over an hour.  You have severe headaches that do not go away with medicine.  You have vision changes. Document Released: 09/08/2001 Document Revised: 09/19/2013 Document Reviewed: 11/15/2012 ExitCare Patient Information 2015 Exit[employer]. This information is not intended to replace advice given to you by your health care provider. Make sure you discuss any questions you have with your health care provider.   

## 2015-02-20 LAB — GLUCOSE TOLERANCE, 2 HOURS W/ 1HR
GLUCOSE, 1 HOUR: 127 mg/dL (ref 65–179)
GLUCOSE, FASTING: 83 mg/dL (ref 65–91)
Glucose, 2 hour: 101 mg/dL (ref 65–152)

## 2015-02-20 LAB — CBC
HEMOGLOBIN: 12.8 g/dL (ref 11.1–15.9)
Hematocrit: 38.8 % (ref 34.0–46.6)
MCH: 29.5 pg (ref 26.6–33.0)
MCHC: 33 g/dL (ref 31.5–35.7)
MCV: 89 fL (ref 79–97)
PLATELETS: 232 10*3/uL (ref 150–379)
RBC: 4.34 x10E6/uL (ref 3.77–5.28)
RDW: 13.6 % (ref 12.3–15.4)
WBC: 7 10*3/uL (ref 3.4–10.8)

## 2015-02-20 LAB — ANTIBODY SCREEN: ANTIBODY SCREEN: NEGATIVE

## 2015-02-20 LAB — HIV ANTIBODY (ROUTINE TESTING W REFLEX): HIV Screen 4th Generation wRfx: NONREACTIVE

## 2015-02-20 LAB — HSV 2 ANTIBODY, IGG: HSV 2 Glycoprotein G Ab, IgG: <0.91 index (ref 0.00–0.90)

## 2015-02-20 LAB — US OB FOLLOW UP

## 2015-02-20 LAB — RPR: RPR Ser Ql: NONREACTIVE

## 2015-03-13 ENCOUNTER — Ambulatory Visit (INDEPENDENT_AMBULATORY_CARE_PROVIDER_SITE_OTHER): Payer: Medicaid Other | Admitting: Women's Health

## 2015-03-13 ENCOUNTER — Other Ambulatory Visit: Payer: Self-pay | Admitting: Women's Health

## 2015-03-13 ENCOUNTER — Ambulatory Visit (INDEPENDENT_AMBULATORY_CARE_PROVIDER_SITE_OTHER): Payer: Medicaid Other

## 2015-03-13 VITALS — BP 114/78 | HR 68 | Wt 193.0 lb

## 2015-03-13 DIAGNOSIS — O26843 Uterine size-date discrepancy, third trimester: Secondary | ICD-10-CM

## 2015-03-13 DIAGNOSIS — Z3403 Encounter for supervision of normal first pregnancy, third trimester: Secondary | ICD-10-CM

## 2015-03-13 DIAGNOSIS — O283 Abnormal ultrasonic finding on antenatal screening of mother: Secondary | ICD-10-CM

## 2015-03-13 DIAGNOSIS — Z331 Pregnant state, incidental: Secondary | ICD-10-CM

## 2015-03-13 DIAGNOSIS — Z1389 Encounter for screening for other disorder: Secondary | ICD-10-CM

## 2015-03-13 LAB — POCT URINALYSIS DIPSTICK
GLUCOSE UA: NEGATIVE
KETONES UA: NEGATIVE
Leukocytes, UA: NEGATIVE
Nitrite, UA: NEGATIVE
Protein, UA: NEGATIVE
RBC UA: NEGATIVE

## 2015-03-13 NOTE — Patient Instructions (Signed)
Call the office ([phone]) or go to [facility] if:  You begin to have strong, frequent contractions  Your water breaks.  Sometimes it is a big gush of fluid, sometimes it is just a trickle that keeps getting your panties wet or running down your legs  You have vaginal bleeding.  It is normal to have a small amount of spotting if your cervix was checked.   You don't feel your baby moving like normal.  If you don't, get you something to eat and drink and lay down and focus on feeling your baby move.  You should feel at least 10 movements in 2 hours.  If you don't, you should call the office or go to [facility].    Preterm Labor Information Preterm labor is when labor starts at less than 37 weeks of pregnancy. The normal length of a pregnancy is 39 to 41 weeks. CAUSES Often, there is no identifiable underlying cause as to why a woman goes into preterm labor. One of the most common known causes of preterm labor is infection. Infections of the uterus, cervix, vagina, amniotic sac, bladder, kidney, or even the lungs (pneumonia) can cause labor to start. Other suspected causes of preterm labor include:   Urogenital infections, such as yeast infections and bacterial vaginosis.   Uterine abnormalities (uterine shape, uterine septum, fibroids, or bleeding from the placenta).   A cervix that has been operated on (it may fail to stay closed).   Malformations in the fetus.   Multiple gestations (twins, triplets, and so on).   Breakage of the amniotic sac.  RISK FACTORS  Having a previous history of preterm labor.   Having premature rupture of membranes (PROM).   Having a placenta that covers the opening of the cervix (placenta previa).   Having a placenta that separates from the uterus (placental abruption).   Having a cervix that is too weak to hold the fetus in the uterus (incompetent cervix).   Having too much fluid in the amniotic sac (polyhydramnios).   Taking  illegal drugs or smoking while pregnant.   Not gaining enough weight while pregnant.   Being younger than 18 and older than 21 years old.   Having a low socioeconomic status.   Being African American. SYMPTOMS Signs and symptoms of preterm labor include:   Menstrual-like cramps, abdominal pain, or back pain.  Uterine contractions that are regular, as frequent as six in an hour, regardless of their intensity (may be mild or painful).  Contractions that start on the top of the uterus and spread down to the lower abdomen and back.   A sense of increased pelvic pressure.   A watery or bloody mucus discharge that comes from the vagina.  TREATMENT Depending on the length of the pregnancy and other circumstances, your health care provider may suggest bed rest. If necessary, there are medicines that can be given to stop contractions and to mature the fetal lungs. If labor happens before 34 weeks of pregnancy, a prolonged hospital stay may be recommended. Treatment depends on the condition of both you and the fetus.  WHAT SHOULD YOU DO IF YOU THINK YOU ARE IN PRETERM LABOR? Call your health care provider right away. You will need to go to the hospital to get checked immediately. HOW CAN YOU PREVENT PRETERM LABOR IN FUTURE PREGNANCIES? You should:   Stop smoking if you smoke.  Maintain healthy weight gain and avoid chemicals and drugs that are not necessary.  Be watchful for   any type of infection.  Inform your health care provider if you have a known history of preterm labor. Document Released: 12/05/2003 Document Revised: 05/17/2013 Document Reviewed: 10/17/2012 ExitCare Patient Information 2015 ExitCare, LLC. This information is not intended to replace advice given to you by your health care provider. Make sure you discuss any questions you have with your health care provider.  

## 2015-03-13 NOTE — Progress Notes (Signed)
31+4wks,efw 1454g 22.2%,lagging AC 28WKS <2.3%,cephalic,normal ov's bilat, LVEIF n/c, RI .61,S/D 2.59,AFI 11CM,fht 129bpm

## 2015-03-13 NOTE — Progress Notes (Signed)
Low-risk OB appointment G1P0 [redacted]w[redacted]d Estimated Date of Delivery: 05/11/15 BP 114/78 mmHg  Pulse 68  Wt 193 lb (87.544 kg)  LMP 07/31/2014 (Approximate)  BP, weight, and urine reviewed.  Refer to obstetrical flow sheet for FH & FHR.  Reports good fm.  Denies regular uc's, lof, vb, or uti s/s. No complaints. Reviewed today's u/s for [facility] lagging by 3wks- still normal efw 22%, AC still lagging by 3wks- all other measurements normal, normal afi & dopplers- discussed w/ JVF- no need to continue monitoring. Discussed ptl s/s, fkc Plan:  Continue routine obstetrical care  F/U in 2wks for OB appointment

## 2015-03-27 ENCOUNTER — Ambulatory Visit (INDEPENDENT_AMBULATORY_CARE_PROVIDER_SITE_OTHER): Payer: Medicaid Other | Admitting: Women's Health

## 2015-03-27 ENCOUNTER — Encounter: Payer: Self-pay | Admitting: Women's Health

## 2015-03-27 VITALS — BP 122/80 | HR 84 | Wt 193.0 lb

## 2015-03-27 DIAGNOSIS — Z331 Pregnant state, incidental: Secondary | ICD-10-CM

## 2015-03-27 DIAGNOSIS — Z3403 Encounter for supervision of normal first pregnancy, third trimester: Secondary | ICD-10-CM

## 2015-03-27 DIAGNOSIS — Z1389 Encounter for screening for other disorder: Secondary | ICD-10-CM

## 2015-03-27 LAB — POCT URINALYSIS DIPSTICK
Glucose, UA: NEGATIVE
KETONES UA: NEGATIVE
Leukocytes, UA: NEGATIVE
Nitrite, UA: NEGATIVE
RBC UA: NEGATIVE

## 2015-03-27 NOTE — Progress Notes (Signed)
Low-risk OB appointment G1P0 [id number] Estimated Date of Delivery: 05/11/15 BP 122/80 mmHg  Pulse 84  Wt 193 lb (87.544 kg)  LMP 07/31/2014 (Approximate)  BP, weight, and urine reviewed.  Refer to obstetrical flow sheet for FH & FHR.  Reports good fm.  Denies regular uc's, lof, vb, or uti s/s. No complaints. Reviewed ptl s/s, fkc. Plan:  Continue routine obstetrical care  F/U in 2wks for OB appointment

## 2015-03-27 NOTE — Patient Instructions (Signed)
Call the office ([phone]) or go to [facility] if:  You begin to have strong, frequent contractions  Your water breaks.  Sometimes it is a big gush of fluid, sometimes it is just a trickle that keeps getting your panties wet or running down your legs  You have vaginal bleeding.  It is normal to have a small amount of spotting if your cervix was checked.   You don't feel your baby moving like normal.  If you don't, get you something to eat and drink and lay down and focus on feeling your baby move.  You should feel at least 10 movements in 2 hours.  If you don't, you should call the office or go to [facility].    Preterm Labor Information Preterm labor is when labor starts at less than 37 weeks of pregnancy. The normal length of a pregnancy is 39 to 41 weeks. CAUSES Often, there is no identifiable underlying cause as to why a woman goes into preterm labor. One of the most common known causes of preterm labor is infection. Infections of the uterus, cervix, vagina, amniotic sac, bladder, kidney, or even the lungs (pneumonia) can cause labor to start. Other suspected causes of preterm labor include:   Urogenital infections, such as yeast infections and bacterial vaginosis.   Uterine abnormalities (uterine shape, uterine septum, fibroids, or bleeding from the placenta).   A cervix that has been operated on (it may fail to stay closed).   Malformations in the fetus.   Multiple gestations (twins, triplets, and so on).   Breakage of the amniotic sac.  RISK FACTORS  Having a previous history of preterm labor.   Having premature rupture of membranes (PROM).   Having a placenta that covers the opening of the cervix (placenta previa).   Having a placenta that separates from the uterus (placental abruption).   Having a cervix that is too weak to hold the fetus in the uterus (incompetent cervix).   Having too much fluid in the amniotic sac (polyhydramnios).   Taking  illegal drugs or smoking while pregnant.   Not gaining enough weight while pregnant.   Being younger than 18 and older than 21 years old.   Having a low socioeconomic status.   Being African American. SYMPTOMS Signs and symptoms of preterm labor include:   Menstrual-like cramps, abdominal pain, or back pain.  Uterine contractions that are regular, as frequent as six in an hour, regardless of their intensity (may be mild or painful).  Contractions that start on the top of the uterus and spread down to the lower abdomen and back.   A sense of increased pelvic pressure.   A watery or bloody mucus discharge that comes from the vagina.  TREATMENT Depending on the length of the pregnancy and other circumstances, your health care provider may suggest bed rest. If necessary, there are medicines that can be given to stop contractions and to mature the fetal lungs. If labor happens before 34 weeks of pregnancy, a prolonged hospital stay may be recommended. Treatment depends on the condition of both you and the fetus.  WHAT SHOULD YOU DO IF YOU THINK YOU ARE IN PRETERM LABOR? Call your health care provider right away. You will need to go to the hospital to get checked immediately. HOW CAN YOU PREVENT PRETERM LABOR IN FUTURE PREGNANCIES? You should:   Stop smoking if you smoke.  Maintain healthy weight gain and avoid chemicals and drugs that are not necessary.  Be watchful for   any type of infection.  Inform your health care provider if you have a known history of preterm labor. Document Released: 12/05/2003 Document Revised: 05/17/2013 Document Reviewed: 10/17/2012 ExitCare Patient Information 2015 ExitCare, LLC. This information is not intended to replace advice given to you by your health care provider. Make sure you discuss any questions you have with your health care provider.  

## 2015-03-30 ENCOUNTER — Encounter (HOSPITAL_COMMUNITY): Payer: Self-pay | Admitting: *Deleted

## 2015-03-30 ENCOUNTER — Emergency Department (HOSPITAL_COMMUNITY)
Admission: EM | Admit: 2015-03-30 | Discharge: 2015-03-31 | Disposition: A | Discharge status: Home / Self Care | Payer: Medicaid Other | Attending: Emergency Medicine | Admitting: Emergency Medicine

## 2015-03-30 DIAGNOSIS — O479 False labor, unspecified: Secondary | ICD-10-CM | POA: Diagnosis not present

## 2015-03-30 DIAGNOSIS — Z3A35 35 weeks gestation of pregnancy: Secondary | ICD-10-CM | POA: Insufficient documentation

## 2015-03-30 DIAGNOSIS — R109 Unspecified abdominal pain: Secondary | ICD-10-CM

## 2015-03-30 DIAGNOSIS — R300 Dysuria: Secondary | ICD-10-CM | POA: Insufficient documentation

## 2015-03-30 DIAGNOSIS — R110 Nausea: Secondary | ICD-10-CM | POA: Insufficient documentation

## 2015-03-30 DIAGNOSIS — O26899 Other specified pregnancy related conditions, unspecified trimester: Secondary | ICD-10-CM

## 2015-03-30 DIAGNOSIS — Z79899 Other long term (current) drug therapy: Secondary | ICD-10-CM | POA: Insufficient documentation

## 2015-03-30 DIAGNOSIS — O9989 Other specified diseases and conditions complicating pregnancy, childbirth and the puerperium: Secondary | ICD-10-CM | POA: Diagnosis not present

## 2015-03-30 DIAGNOSIS — Z880 Allergy status to penicillin: Secondary | ICD-10-CM | POA: Insufficient documentation

## 2015-03-30 MED ORDER — SODIUM CHLORIDE 0.9 % IV BOLUS (SEPSIS)
1000.0000 mL | Freq: Once | INTRAVENOUS | Status: AC
  Administered 2015-03-31: 1000 mL via INTRAVENOUS

## 2015-03-30 NOTE — ED Notes (Signed)
Pt c/o lower back pain that radiates around to front lower abd area that started two days ago that have gotten worse, pain is now associated with nausea, due date of 05-11-2015, first pregnancy, pt reports that the pregnancy has been normal so far, denies any vaginal discharge,

## 2015-03-31 LAB — URINALYSIS, ROUTINE W REFLEX MICROSCOPIC
Bilirubin Urine: NEGATIVE
Glucose, UA: NEGATIVE mg/dL
Ketones, ur: NEGATIVE mg/dL
LEUKOCYTES UA: NEGATIVE
Nitrite: NEGATIVE
PROTEIN: NEGATIVE mg/dL
SPECIFIC GRAVITY, URINE: 1.01 (ref 1.005–1.030)
UROBILINOGEN UA: 0.2 mg/dL (ref 0.0–1.0)
pH: 7 (ref 5.0–8.0)

## 2015-03-31 LAB — URINE MICROSCOPIC-ADD ON

## 2015-03-31 NOTE — Discharge Instructions (Signed)
You were seen today for abdominal pain and possible contractions during pregnancy. You do not appear to be contracting actively. Your urine is reassuring. You were noted to have several blood pressure readings that were greater than 120/80. These improved with laying on her side. You need to follow-up closely with your OB/GYN. If you develop loss of fluids, bleeding, worsening abdominal pain, headaches, dizziness, or vision changes you should return for reevaluation.  Abdominal Pain During Pregnancy Abdominal pain is common in pregnancy. Most of the time, it does not cause harm. There are many causes of abdominal pain. Some causes are more serious than others. Some of the causes of abdominal pain in pregnancy are easily diagnosed. Occasionally, the diagnosis takes time to understand. Other times, the cause is not determined. Abdominal pain can be a sign that something is very wrong with the pregnancy, or the pain may have nothing to do with the pregnancy at all. For this reason, always tell your health care provider if you have any abdominal discomfort. HOME CARE INSTRUCTIONS  Monitor your abdominal pain for any changes. The following actions may help to alleviate any discomfort you are experiencing:  Do not have sexual intercourse or put anything in your vagina until your symptoms go away completely.  Get plenty of rest until your pain improves.  Drink clear fluids if you feel nauseous. Avoid solid food as long as you are uncomfortable or nauseous.  Only take over-the-counter or prescription medicine as directed by your health care provider.  Keep all follow-up appointments with your health care provider. SEEK IMMEDIATE MEDICAL CARE IF:  You are bleeding, leaking fluid, or passing tissue from the vagina.  You have increasing pain or cramping.  You have persistent vomiting.  You have painful or bloody urination.  You have a fever.  You notice a decrease in your baby's movements.  You  have extreme weakness or feel faint.  You have shortness of breath, with or without abdominal pain.  You develop a severe headache with abdominal pain.  You have abnormal vaginal discharge with abdominal pain.  You have persistent diarrhea.  You have abdominal pain that continues even after rest, or gets worse. MAKE SURE YOU:   Understand these instructions.  Will watch your condition.  Will get help right away if you are not doing well or get worse. Document Released: 09/14/2005 Document Revised: 07/05/2013 Document Reviewed: 04/13/2013 [facility] Patient Information 2015 [place], [place]. This information is not intended to replace advice given to you by your health care provider. Make sure you discuss any questions you have with your health care provider.

## 2015-03-31 NOTE — Progress Notes (Signed)
Dr Penne LashLeggett with order for discharge to home.  Juliette AlcideMelinda RN called and notified.  [facility]

## 2015-03-31 NOTE — Progress Notes (Signed)
ED RN, Juliette AlcideMelinda called to adjust monitor.

## 2015-03-31 NOTE — ED Provider Notes (Signed)
CSN: [id number]     Arrival date & time 03/30/15  2336 History   First MD Initiated Contact with Patient 03/30/15 2350     Chief Complaint  Patient presents with  . Contractions     (Consider location/radiation/quality/duration/timing/severity/associated sxs/prior Treatment) HPI  This is a 19 year old G1P0 at 5934 and [redacted] weeks pregnant who presents with abdominal pain. Patient reports 2 day history of pain that radiates over the bilateral flanks and into her abdomen. She believes these are contractions. She states they have gotten worse. She reports associated nausea. She states that she is having approximately 2 contractions an hour. Denies any complications with this pregnancy. Denies any loss of fluid or vaginal bleeding. Does report that her urination has felt "hot."  Dr. Despina HiddenEure is primary OB.  Ultrasound are system reviewed. Patient with anterior placenta and estimated due date of 05/11/2015.  History reviewed. No pertinent past medical history. Past Surgical History  Procedure Laterality Date  . Wisdom tooth extraction     Family History  Problem Relation Age of Onset  . Cancer Mother   . Clotting disorder Mother   . Venous thrombosis Mother   . Diabetes Maternal Grandmother   . Diabetes Maternal Grandfather    History  Substance Use Topics  . Smoking status: Never Smoker   . Smokeless tobacco: Never Used  . Alcohol Use: No   OB History    Gravida Para Term Preterm AB TAB SAB Ectopic Multiple Living   1              Review of Systems  Constitutional: Negative for fever.  Respiratory: Negative for chest tightness and shortness of breath.   Cardiovascular: Negative for chest pain.  Gastrointestinal: Positive for nausea and abdominal pain. Negative for vomiting and constipation.  Genitourinary: Positive for dysuria. Negative for vaginal bleeding and vaginal discharge.  Musculoskeletal: Negative for back pain.  Neurological: Negative for headaches.  All other systems  reviewed and are negative.     Allergies  Penicillins  Home Medications   Prior to Admission medications   Medication Sig Start Date End Date Taking? Authorizing Provider  ibuprofen (ADVIL,MOTRIN) 800 MG tablet Take 800 mg by mouth every 8 (eight) hours as needed for mild pain.    Historical Provider, MD  omeprazole (PRILOSEC) 20 MG capsule Take 1 capsule (20 mg total) by mouth daily. Patient not taking: Reported on 01/22/2015 06/07/14   Sofie RowerMike Stengel, MD  Prenatal Vit-Fe Fumarate-FA (PRENATAL VITAMIN PO) Take by mouth.    Historical Provider, MD  prenatal vitamin w/FE, FA (PRENATAL 1 + 1) 27-1 MG TABS tablet Take 1 tablet by mouth daily at 12 noon. 11/08/14   Tilda BurrowJohn Ferguson V, MD   BP 120/74 mmHg  Pulse 70  Temp(Src) 97.9 F (36.6 C) (Oral)  Resp 20  Ht 5\' 2"  (1.575 m)  Wt 193 lb (87.544 kg)  BMI 35.29 kg/m2  SpO2 100%  LMP 07/31/2014 (Approximate) Physical Exam  Constitutional: She is oriented to person, place, and time. She appears well-developed and well-nourished. No distress.  HENT:  Head: Normocephalic and atraumatic.  Cardiovascular: Normal rate, regular rhythm and normal heart sounds.   No murmur heard. Pulmonary/Chest: Effort normal and breath sounds normal. No respiratory distress. She has no wheezes.  Abdominal: Soft. Bowel sounds are normal. There is no tenderness. There is no rebound and no guarding.  Gravid above the umbilicus  Genitourinary:  Cervix thick, high, closed  Neurological: She is alert and oriented to person, place, and  time.  Skin: Skin is warm and dry.  Psychiatric: She has a normal mood and affect.  Nursing note and vitals reviewed.   ED Course  Procedures (including critical care time) Labs Review Labs Reviewed  URINALYSIS, ROUTINE W REFLEX MICROSCOPIC (NOT AT [facility]) - Abnormal; Notable for the following:    Hgb urine dipstick TRACE (*)    All other components within normal limits  URINE MICROSCOPIC-ADD ON - Abnormal; Notable for the  following:    Squamous Epithelial / LPF FEW (*)    All other components within normal limits    Imaging Review No results found.   EKG Interpretation None      MDM   Final diagnoses:  Abdominal pain affecting pregnancy    Patient presents with abdominal pain. She really she is having contractions. Nontoxic on exam. No dilation of the cervix. No contractions picked up on tocometry. Fetal heart rate 160. Patient was noted to have a blood pressure of 138/86. No known history of hypertension during this pregnancy. Patient was repositioned. She is given fluids and urinalysis is pending.  [facility] No contractions. Patient blood pressure improved with laying on her side. Per RN at [facility], patient is safe to follow-up with Dr. Despina Hidden No evidence of preterm labor. Patient was given return precautions.  After history, exam, and medical workup I feel the patient has been appropriately medically screened and is safe for discharge home. Pertinent diagnoses were discussed with the patient. Patient was given return precautions.     Shon Baton, MD 03/31/15 [phone]

## 2015-03-31 NOTE — ED Notes (Signed)
Discharge instructions given, pt demonstrated teach back and verbal understanding. No concerns voiced.  

## 2015-03-31 NOTE — ED Notes (Signed)
[phone], no contractions noted on monitor, RN is aware of pt lying on side and bp of 135/90

## 2015-03-31 NOTE — Progress Notes (Signed)
Call received from Jeani HawkingAnnie Penn, RN regarding pt. Pt presented with hip and lower abdominal pain as a G1P) at 34.1 weeks [facility]. Pt to monitor by ED RN.

## 2015-03-31 NOTE — ED Notes (Signed)
Pt fetal monitor repositioned, fetal heart rate 160

## 2015-03-31 NOTE — Progress Notes (Signed)
Juliette AlcideMelinda, RN called to adjust FHR monitors.  Pt cl/th/high per ED MD.  Strip reviewed by Zerita Boersarlene Lawson, CNM.

## 2015-03-31 NOTE — ED Notes (Signed)
Pelvic exam performed by Dr Wilkie AyeHorton, pt tolerated well, pt lying on left side,

## 2015-04-07 LAB — US OB FOLLOW UP

## 2015-04-10 ENCOUNTER — Ambulatory Visit (INDEPENDENT_AMBULATORY_CARE_PROVIDER_SITE_OTHER): Payer: Medicaid Other

## 2015-04-10 ENCOUNTER — Ambulatory Visit (INDEPENDENT_AMBULATORY_CARE_PROVIDER_SITE_OTHER): Payer: Medicaid Other | Admitting: Advanced Practice Midwife

## 2015-04-10 ENCOUNTER — Other Ambulatory Visit: Payer: Self-pay | Admitting: Advanced Practice Midwife

## 2015-04-10 VITALS — BP 124/72 | HR 64 | Wt 197.0 lb

## 2015-04-10 DIAGNOSIS — O26843 Uterine size-date discrepancy, third trimester: Secondary | ICD-10-CM | POA: Diagnosis not present

## 2015-04-10 DIAGNOSIS — O289 Unspecified abnormal findings on antenatal screening of mother: Secondary | ICD-10-CM

## 2015-04-10 DIAGNOSIS — Z1389 Encounter for screening for other disorder: Secondary | ICD-10-CM

## 2015-04-10 DIAGNOSIS — O283 Abnormal ultrasonic finding on antenatal screening of mother: Secondary | ICD-10-CM | POA: Insufficient documentation

## 2015-04-10 DIAGNOSIS — Z3A35 35 weeks gestation of pregnancy: Secondary | ICD-10-CM

## 2015-04-10 DIAGNOSIS — Z331 Pregnant state, incidental: Secondary | ICD-10-CM

## 2015-04-10 DIAGNOSIS — Z3403 Encounter for supervision of normal first pregnancy, third trimester: Secondary | ICD-10-CM

## 2015-04-10 DIAGNOSIS — O0993 Supervision of high risk pregnancy, unspecified, third trimester: Secondary | ICD-10-CM

## 2015-04-10 LAB — POCT URINALYSIS DIPSTICK
GLUCOSE UA: NEGATIVE
Ketones, UA: NEGATIVE
Leukocytes, UA: NEGATIVE
Nitrite, UA: NEGATIVE

## 2015-04-10 NOTE — Progress Notes (Addendum)
US 35+4wks small for dates,EFW 1784G <10%Williams, <3% [place],BPD <2.3%,HC <2.3%,AC <2.3%,FL 7.3%,ant pl gr 2, afi 12cm,cephalic,normal ov's bilat,RI .62,.55,BPP 8/8,lveif n/c,pt will see Drenda FreezeFran after us.

## 2015-04-10 NOTE — Addendum Note (Signed)
Addended by: Jacklyn ShellRESENZO-DISHMON, Liyah Higham on: 04/10/2015 03:56 PM   Modules accepted: Level of Service

## 2015-04-10 NOTE — Progress Notes (Addendum)
G1P0 3334w4d Estimated Date of Delivery: 05/11/15  Last menstrual period 07/31/2014.   BP weight and urine results all reviewed and noted.  Please refer to the obstetrical flow sheet for the fundal height and fetal heart rate documentation: Last US showed AC <2.3%. Will work in for EFW after this visit  Patient reports good fetal movement, denies any bleeding and no rupture of membranes symptoms or regular contractions. Patient is without complaints. All questions were answered.  Plan:  Continued routine obstetrical care, EFW US after this visit  Addendum:  IUGR <3%, normal dopplers, 8/8 bpp.  Discussed with LHE.  Twice weekly testing, IOL 37 weeks or if clinically indicated

## 2015-04-16 ENCOUNTER — Ambulatory Visit (INDEPENDENT_AMBULATORY_CARE_PROVIDER_SITE_OTHER): Payer: Medicaid Other | Admitting: Obstetrics & Gynecology

## 2015-04-16 ENCOUNTER — Other Ambulatory Visit: Payer: Medicaid Other

## 2015-04-16 VITALS — BP 144/82 | HR 72 | Wt 198.0 lb

## 2015-04-16 DIAGNOSIS — Z3685 Encounter for antenatal screening for Streptococcus B: Secondary | ICD-10-CM

## 2015-04-16 DIAGNOSIS — O0993 Supervision of high risk pregnancy, unspecified, third trimester: Secondary | ICD-10-CM

## 2015-04-16 DIAGNOSIS — O365990 Maternal care for other known or suspected poor fetal growth, unspecified trimester, not applicable or unspecified: Secondary | ICD-10-CM

## 2015-04-16 DIAGNOSIS — Z1389 Encounter for screening for other disorder: Secondary | ICD-10-CM

## 2015-04-16 DIAGNOSIS — Z369 Encounter for antenatal screening, unspecified: Secondary | ICD-10-CM

## 2015-04-16 DIAGNOSIS — Z331 Pregnant state, incidental: Secondary | ICD-10-CM

## 2015-04-16 LAB — POCT URINALYSIS DIPSTICK
Blood, UA: NEGATIVE
GLUCOSE UA: NEGATIVE
KETONES UA: NEGATIVE
Leukocytes, UA: NEGATIVE
Nitrite, UA: NEGATIVE

## 2015-04-16 LAB — OB RESULTS CONSOLE GC/CHLAMYDIA
Chlamydia: NEGATIVE
GC PROBE AMP, GENITAL: NEGATIVE

## 2015-04-16 LAB — OB RESULTS CONSOLE GBS: STREP GROUP B AG: NEGATIVE

## 2015-04-16 NOTE — Addendum Note (Signed)
Addended by: Criss AlvinePULLIAM, Diamond Jentz G on: 04/16/2015 04:14 PM   Modules accepted: Orders

## 2015-04-16 NOTE — Progress Notes (Signed)
Fetal Surveillance Testing today:  Reactive NST   High Risk Pregnancy Diagnosis(es):   IUGR<3% (Hadlock, <10% Mayford KnifeWilliams but Mayford KnifeWilliams does not give anything more specific than less that 10%)  G1P0 6557w3d Estimated Date of Delivery: 05/11/15  Blood pressure 144/82, pulse 72, weight 198 lb (89.812 kg), last menstrual period 07/31/2014.  Urinalysis: pt can not void   HPI: The patient is being seen today for ongoing management of IUGR. Today she reports pelvic pressure   BP weight and urine results all reviewed and noted. Patient reports good fetal movement, denies any bleeding and no rupture of membranes symptoms or regular contractions.  Fundal Height:  34 Fetal Heart rate:  150 Edema:  none  Patient is without complaints other than noted in her HPI. All questions were answered.  All lab and sonogram results have been reviewed. Comments: abnormal: IUGR   Assessment:  1.  Pregnancy at 6457w3d,  Estimated Date of Delivery: 05/11/15 :                          2.  IUGR <3%, no other complicating factors                        3.  BP has creeped up a bit as well for first time  Medication(s) Plans:    Treatment Plan:  Induction 7/23 @37  weeks due to <3% efw  Follow up in 6 weeks for appointment for high risk OB care, pp exam

## 2015-04-17 ENCOUNTER — Encounter: Payer: Medicaid Other | Admitting: Advanced Practice Midwife

## 2015-04-17 ENCOUNTER — Telehealth (HOSPITAL_COMMUNITY): Payer: Self-pay | Admitting: *Deleted

## 2015-04-17 ENCOUNTER — Encounter (HOSPITAL_COMMUNITY): Payer: Self-pay | Admitting: *Deleted

## 2015-04-17 LAB — GC/CHLAMYDIA PROBE AMP
Chlamydia trachomatis, NAA: NEGATIVE
NEISSERIA GONORRHOEAE BY PCR: NEGATIVE

## 2015-04-17 NOTE — Telephone Encounter (Signed)
Preadmission screen  

## 2015-04-18 LAB — STREP GP B NAA+RFLX: STREP GP B NAA+RFLX: NEGATIVE

## 2015-04-19 ENCOUNTER — Ambulatory Visit (INDEPENDENT_AMBULATORY_CARE_PROVIDER_SITE_OTHER): Payer: Medicaid Other | Admitting: Obstetrics & Gynecology

## 2015-04-19 ENCOUNTER — Other Ambulatory Visit: Payer: Medicaid Other

## 2015-04-19 ENCOUNTER — Encounter: Payer: Self-pay | Admitting: Obstetrics & Gynecology

## 2015-04-19 VITALS — BP 110/70 | HR 84 | Wt 200.0 lb

## 2015-04-19 DIAGNOSIS — O283 Abnormal ultrasonic finding on antenatal screening of mother: Secondary | ICD-10-CM

## 2015-04-19 DIAGNOSIS — Z1389 Encounter for screening for other disorder: Secondary | ICD-10-CM

## 2015-04-19 DIAGNOSIS — O365931 Maternal care for other known or suspected poor fetal growth, third trimester, fetus 1: Secondary | ICD-10-CM | POA: Diagnosis not present

## 2015-04-19 DIAGNOSIS — O0993 Supervision of high risk pregnancy, unspecified, third trimester: Secondary | ICD-10-CM

## 2015-04-19 DIAGNOSIS — O365990 Maternal care for other known or suspected poor fetal growth, unspecified trimester, not applicable or unspecified: Secondary | ICD-10-CM | POA: Diagnosis not present

## 2015-04-19 DIAGNOSIS — Z331 Pregnant state, incidental: Secondary | ICD-10-CM

## 2015-04-19 LAB — POCT URINALYSIS DIPSTICK
Blood, UA: NEGATIVE
Glucose, UA: NEGATIVE
Ketones, UA: NEGATIVE
Leukocytes, UA: NEGATIVE
NITRITE UA: NEGATIVE
PROTEIN UA: 3

## 2015-04-19 NOTE — Progress Notes (Signed)
Fetal Surveillance Testing today:  Reactive NST   High Risk Pregnancy Diagnosis(es):   IUGR < than 3rd percentile  Hadlock  G1P0 [redacted]w[redacted]d Estimated Date of Delivery: 05/11/15  Blood pressure 110/70, pulse 84, weight 200 lb (90.719 kg), last menstrual period 07/31/2014.  Urinalysis: Positive for 3+ protein today   HPI: The patient is being seen today for ongoing management of IUGR. Today she reports no problems   BP weight and urine results all reviewed and noted. Patient reports good fetal movement, denies any bleeding and no rupture of membranes symptoms or regular contractions.  Fundal Height:  34 Fetal Heart rate:  145 Edema:  None  Patient is without complaints other than noted in her HPI. All questions were answered.  All lab and sonogram results have been reviewed. Comments: abnormal: Fetal growth   Assessment:  1.  Pregnancy at [redacted]w[redacted]d,  Estimated Date of Delivery: 05/11/15 :                          2.  IUGR                        3.  3+ protein today with normal blood pressure  Medication(s) Plans:  No changes  Treatment Plan:  Induction scheduled for tomorrow night 2345  Follow up in 6 weeks for appointment for high risk OB care, postpartum exam  Recommend repeat protein in urine at time of admission and protein creatinine ratio if remains elevated Blood pressure does not indicate preeclampsia

## 2015-04-20 VITALS — BP 114/75 | HR 85 | Temp 98.7°F | Resp 16 | Ht 64.0 in | Wt 198.0 lb

## 2015-04-20 NOTE — Progress Notes (Signed)
Pt scheduled for induction but due to high unit census, OK to delay induction per Dr. Debroah Loop. Pt without complaints, states baby is active. Informed that we will call when room is available and if pt needs to be seen at any time, to come to MAU.

## 2015-04-21 ENCOUNTER — Inpatient Hospital Stay (HOSPITAL_COMMUNITY)
Admission: RE | Admit: 2015-04-21 | Discharge: 2015-04-25 | DRG: 775 | Disposition: A | Discharge status: Home / Self Care | Payer: Medicaid Other | Source: Ambulatory Visit | Attending: Obstetrics and Gynecology | Admitting: Obstetrics and Gynecology

## 2015-04-21 ENCOUNTER — Encounter (HOSPITAL_COMMUNITY): Payer: Self-pay

## 2015-04-21 DIAGNOSIS — Z6833 Body mass index (BMI) 33.0-33.9, adult: Secondary | ICD-10-CM | POA: Diagnosis not present

## 2015-04-21 DIAGNOSIS — R0981 Nasal congestion: Secondary | ICD-10-CM | POA: Diagnosis present

## 2015-04-21 DIAGNOSIS — Z3A37 37 weeks gestation of pregnancy: Secondary | ICD-10-CM | POA: Diagnosis present

## 2015-04-21 DIAGNOSIS — O99214 Obesity complicating childbirth: Secondary | ICD-10-CM | POA: Diagnosis present

## 2015-04-21 DIAGNOSIS — Z3403 Encounter for supervision of normal first pregnancy, third trimester: Secondary | ICD-10-CM

## 2015-04-21 DIAGNOSIS — Z833 Family history of diabetes mellitus: Secondary | ICD-10-CM | POA: Diagnosis not present

## 2015-04-21 DIAGNOSIS — O365930 Maternal care for other known or suspected poor fetal growth, third trimester, not applicable or unspecified: Secondary | ICD-10-CM | POA: Diagnosis present

## 2015-04-21 DIAGNOSIS — Z8249 Family history of ischemic heart disease and other diseases of the circulatory system: Secondary | ICD-10-CM | POA: Diagnosis not present

## 2015-04-21 DIAGNOSIS — IMO0002 Reserved for concepts with insufficient information to code with codable children: Secondary | ICD-10-CM | POA: Diagnosis present

## 2015-04-21 LAB — CBC
HCT: 35.3 % — ABNORMAL LOW (ref 36.0–46.0)
Hemoglobin: 12 g/dL (ref 12.0–15.0)
MCH: 30.2 pg (ref 26.0–34.0)
MCHC: 34 g/dL (ref 30.0–36.0)
MCV: 88.7 fL (ref 78.0–100.0)
PLATELETS: 184 10*3/uL (ref 150–400)
RBC: 3.98 MIL/uL (ref 3.87–5.11)
RDW: 13.2 % (ref 11.5–15.5)
WBC: 7.1 10*3/uL (ref 4.0–10.5)

## 2015-04-21 LAB — ABO/RH: ABO/RH(D): AB POS

## 2015-04-21 LAB — TYPE AND SCREEN
ABO/RH(D): AB POS
Antibody Screen: NEGATIVE

## 2015-04-21 MED ORDER — LACTATED RINGERS IV SOLN
INTRAVENOUS | Status: DC
  Administered 2015-04-21: 125 mL/h via INTRAVENOUS
  Administered 2015-04-22: 04:00:00 via INTRAVENOUS

## 2015-04-21 MED ORDER — CITRIC ACID-SODIUM CITRATE 334-500 MG/5ML PO SOLN: 30.0000 mL | ORAL | Status: DC | PRN

## 2015-04-21 MED ORDER — TERBUTALINE SULFATE 1 MG/ML IJ SOLN: 0.2500 mg | Freq: Once | INTRAMUSCULAR | Status: AC | PRN

## 2015-04-21 MED ORDER — ONDANSETRON HCL 4 MG/2ML IJ SOLN
4.0000 mg | Freq: Four times a day (QID) | INTRAMUSCULAR | Status: DC | PRN
  Administered 2015-04-22: 4 mg via INTRAVENOUS
  Filled 2015-04-21: qty 2

## 2015-04-21 MED ORDER — OXYTOCIN 40 UNITS IN LACTATED RINGERS INFUSION - SIMPLE MED: 62.5000 mL/h | INTRAVENOUS | Status: DC

## 2015-04-21 MED ORDER — ACETAMINOPHEN 325 MG PO TABS
650.0000 mg | ORAL_TABLET | ORAL | Status: DC | PRN
  Administered 2015-04-21: 650 mg via ORAL
  Filled 2015-04-21: qty 2

## 2015-04-21 MED ORDER — LACTATED RINGERS IV SOLN
500.0000 mL | INTRAVENOUS | Status: DC | PRN
  Administered 2015-04-23 (×2): 500 mL via INTRAVENOUS

## 2015-04-21 MED ORDER — MISOPROSTOL 200 MCG PO TABS
50.0000 ug | ORAL_TABLET | ORAL | Status: DC
  Administered 2015-04-21 – 2015-04-22 (×5): 50 ug via ORAL
  Filled 2015-04-21 (×5): qty 0.5

## 2015-04-21 MED ORDER — OXYTOCIN BOLUS FROM INFUSION
500.0000 mL | INTRAVENOUS | Status: DC
  Administered 2015-04-23: 500 mL via INTRAVENOUS

## 2015-04-21 MED ORDER — OXYCODONE-ACETAMINOPHEN 5-325 MG PO TABS: 2.0000 | ORAL_TABLET | ORAL | Status: DC | PRN

## 2015-04-21 MED ORDER — LIDOCAINE HCL (PF) 1 % IJ SOLN
30.0000 mL | INTRAMUSCULAR | Status: DC | PRN
  Filled 2015-04-21: qty 30

## 2015-04-21 MED ORDER — OXYCODONE-ACETAMINOPHEN 5-325 MG PO TABS: 1.0000 | ORAL_TABLET | ORAL | Status: DC | PRN

## 2015-04-21 NOTE — H&P (Signed)
Christina Ingram is a 19 y.o. female presenting for IOL for IUGR less than 3 %. GBS neg Maternal Medical History:  Reason for admission: IOL for IUGR  Contractions: No contractions  Fetal activity: Perceived fetal activity is normal.   Last perceived fetal movement was within the past hour.    Prenatal complications: IUGR.     OB History    Gravida Para Term Preterm AB TAB SAB Ectopic Multiple Living   1              Past Medical History  Diagnosis Date  . Medical history non-contributory    Past Surgical History  Procedure Laterality Date  . Wisdom tooth extraction     Family History: family history includes Cancer in her mother; Clotting disorder in her mother; Diabetes in her maternal grandfather and maternal grandmother; Hypertension in her maternal grandmother and mother; Venous thrombosis in her mother. Social History:  reports that she has never smoked. She has never used smokeless tobacco. She reports that she does not drink alcohol or use illicit drugs.   Prenatal Transfer Tool  Maternal Diabetes: No Genetic Screening: Normal Maternal Ultrasounds/Referrals: Normal Fetal Ultrasounds or other Referrals:  None Maternal Substance Abuse:  No Significant Maternal Medications:  None Significant Maternal Lab Results:  None Other Comments:  fetal IUGR  Review of Systems  Constitutional: Negative.   HENT: Negative.   Eyes: Negative.   Respiratory: Negative.   Cardiovascular: Negative.   Gastrointestinal: Negative.   Genitourinary: Negative.   Musculoskeletal: Negative.   Skin: Negative.   Neurological: Negative.   Endo/Heme/Allergies: Negative.   Psychiatric/Behavioral: Negative.       Blood pressure 139/75, pulse 81, temperature 98.2 F (36.8 C), resp. rate 18, last menstrual period 07/31/2014. Maternal Exam:  Uterine Assessment: No uc's  Abdomen: Patient reports no abdominal tenderness. Fetal presentation: vertex  Introitus: Normal vulva. Normal vagina.   Amniotic fluid character: not assessed.  Pelvis: adequate for delivery.   Cervix: Cervix evaluated by digital exam.     Fetal Exam Fetal Monitor Review: Mode: ultrasound.   Variability: moderate (6-25 bpm).    Fetal State Assessment: Category I - tracings are normal.     Physical Exam  Constitutional: She is oriented to person, place, and time. She appears well-developed and well-nourished.  HENT:  Head: Normocephalic.  Eyes: Pupils are equal, round, and reactive to light.  Neck: Normal range of motion.  Cardiovascular: Normal rate, regular rhythm, normal heart sounds and intact distal pulses.   Respiratory: Effort normal and breath sounds normal.  GI: Soft. Bowel sounds are normal.  Genitourinary: Vagina normal and uterus normal.  Musculoskeletal: Normal range of motion.  Neurological: She is alert and oriented to person, place, and time. She has normal reflexes.  Skin: Skin is warm and dry.  Psychiatric: She has a normal mood and affect. Her behavior is normal. Judgment and thought content normal.    Prenatal labs: ABO, Rh: AB/--/-- (02/09 1142) Antibody: Negative (05/24 0900) Rubella:   RPR: Non Reactive (05/24 0900)  HBsAg: Negative (02/09 1142)  HIV:    GBS:     Assessment/Plan: Admit, IUGR  SVE cl/70/ -2, GBS neg. Cytotec induction of labor   Wyvonnia Dusky DARLENE 04/21/2015, 9:40 AM

## 2015-04-21 NOTE — Progress Notes (Signed)
Marlynn Perking CNM notified pt taking shower/bath

## 2015-04-21 NOTE — MAU Note (Signed)
Pt presents for scheduled induction for IUGR. Denies any vaginal bleeding or LOF. States baby is active. Waiting for bed in L&D.

## 2015-04-21 NOTE — Progress Notes (Signed)
Christina Ingram is a 19 y.o. G1P0 at [redacted]w[redacted]d by ultrasound admitted for induction of labor due to Poor fetal growth.  Subjective:   Objective: BP 134/82 mmHg  Pulse 82  Temp(Src) 98.1 F (36.7 C) (Oral)  Resp 20  LMP 07/31/2014 (Approximate)      FHT:  FHR: 145-150 bpm, variability: moderate,  accelerations:  Present,  decelerations:  Absent UC:   regular, every 5-9 miinutes SVE:   Dilation: 1 Effacement (%): 50 Station: -1 Exam by:: s grindstaff rn  Labs: Lab Results  Component Value Date   WBC 7.0 02/19/2015   HGB 12.6 11/06/2014   HCT 38.8 02/19/2015   MCV 86 11/06/2014   PLT 247 11/06/2014    Assessment / Plan: Induction of labor due to IUGR,  progressing well on pitocin  Labor: cytotec Preeclampsia:  no signs or symptoms of toxicity, intake and ouput balanced and labs stable Fetal Wellbeing:  Category I Pain Control:  Labor support without medications I/D:  n/a Anticipated MOD:  NSVD  Lani Havlik DARLENE 04/21/2015, 3:14 PM

## 2015-04-21 NOTE — Progress Notes (Signed)
Pt want to take shower before EFM is applied

## 2015-04-21 NOTE — Progress Notes (Signed)
Labor Progress Note  S: very comfortable   O:  BP 139/85 mmHg  Pulse 79  Temp(Src) 98.3 F (36.8 C) (Oral)  Resp 18  LMP 07/31/2014 (Approximate) Cat I: baseline 140 bpm, good variability, + accels, occ mild variable decels Toco: very irregular, mild ctx every 3-8 min  CVE: Dilation: 1 Effacement (%): 50 Cervical Position: Anterior Station: -1 Presentation: Vertex Exam by:: s grindstaff rn  A&P: 19 y.o. G1P0 [redacted]w[redacted]d IOL 2/2 IUGR #labor: continue to augment with cytotec for cervical ripening #anticipate NSVD   De Hollingshead, DO 9:34 PM

## 2015-04-21 NOTE — MAU Note (Signed)
Pt sitting up eating breakfast, tracing maternal heart rate

## 2015-04-22 LAB — HIV ANTIBODY (ROUTINE TESTING W REFLEX): HIV SCREEN 4TH GENERATION: NONREACTIVE

## 2015-04-22 LAB — RPR: RPR Ser Ql: NONREACTIVE

## 2015-04-22 MED ORDER — OXYTOCIN 40 UNITS IN LACTATED RINGERS INFUSION - SIMPLE MED
1.0000 m[IU]/min | INTRAVENOUS | Status: DC
  Administered 2015-04-22: 2 m[IU]/min via INTRAVENOUS
  Filled 2015-04-22: qty 1000

## 2015-04-22 MED ORDER — TERBUTALINE SULFATE 1 MG/ML IJ SOLN: 0.2500 mg | Freq: Once | INTRAMUSCULAR | Status: AC | PRN

## 2015-04-22 MED ORDER — FENTANYL CITRATE (PF) 100 MCG/2ML IJ SOLN
INTRAMUSCULAR | Status: AC
  Administered 2015-04-22: 100 ug via INTRAVENOUS
  Filled 2015-04-22: qty 2

## 2015-04-22 MED ORDER — PSEUDOEPHEDRINE HCL ER 120 MG PO TB12
120.0000 mg | ORAL_TABLET | Freq: Two times a day (BID) | ORAL | Status: DC
  Administered 2015-04-22: 120 mg via ORAL
  Filled 2015-04-22 (×4): qty 1

## 2015-04-22 MED ORDER — LORATADINE 10 MG PO TABS
10.0000 mg | ORAL_TABLET | Freq: Every day | ORAL | Status: DC | PRN
  Administered 2015-04-22: 10 mg via ORAL
  Filled 2015-04-22 (×2): qty 1

## 2015-04-22 MED ORDER — FENTANYL CITRATE (PF) 100 MCG/2ML IJ SOLN
100.0000 ug | INTRAMUSCULAR | Status: DC | PRN
  Administered 2015-04-22 – 2015-04-23 (×3): 100 ug via INTRAVENOUS
  Filled 2015-04-22 (×2): qty 2

## 2015-04-22 MED ORDER — FENTANYL CITRATE (PF) 100 MCG/2ML IJ SOLN: 100.0000 ug | INTRAMUSCULAR | Status: DC | PRN

## 2015-04-22 NOTE — Progress Notes (Signed)
Christina Ingram is a 19 y.o. G1P0 at [redacted]w[redacted]d admitted for induction of labor due to IUGR less than 3%.  Subjective: Doing well. Not in labor . Has had another dose of cytotec approx @ 900am  Objective: BP 140/86 mmHg  Pulse 68  Temp(Src) 98.3 F (36.8 C) (Oral)  Resp 18  LMP 07/31/2014 (Approximate)      FHT:  Cat 1 UC:   irreegular SVE:   Dilation: 1 Effacement (%): 70 Station: -1 Exam by:: hk  Labs: Lab Results  Component Value Date   WBC 7.1 04/21/2015   HGB 12.0 04/21/2015   HCT 35.3* 04/21/2015   MCV 88.7 04/21/2015   PLT 184 04/21/2015    Assessment / Plan: iup @ 37+2 IUGR Cytoec Induction  Labor: Early Preeclampsia:   Fetal Wellbeing:  Category I Pain Control:  Epidural and when pt is in active labor I/D:  GBS negative Anticipated MOD:  NSVD  Ralphine Hinks Grissett 04/22/2015, 10:07 AM

## 2015-04-22 NOTE — Progress Notes (Signed)
Christina Ingram is a 19 y.o. G1P0 at [redacted]w[redacted]d by ultrasound admitted for induction of labor due to Poor fetal growth.  Subjective: Doing well except for pain due to sinus congestion and feeling tired. Not feeling contractions.   Objective: BP 133/89 mmHg  Pulse 71  Temp(Src) 98.2 F (36.8 C) (Oral)  Resp 18  LMP 07/31/2014 (Approximate)      FHT:  FHR: 145 bpm, variability: moderate,  accelerations:  Present,  decelerations:  Absent UC:   Irregular, rare SVE:   Dilation: 1 Effacement (%): 50 Station: -1 Exam by:: s grindstaff rn  Labs: Lab Results  Component Value Date   WBC 7.1 04/21/2015   HGB 12.0 04/21/2015   HCT 35.3* 04/21/2015   MCV 88.7 04/21/2015   PLT 184 04/21/2015    Assessment / Plan: 19 yo GIP0 at [redacted]w[redacted]d IOL 2/2 IUGR <3%. Rare irregular contractions- not feeling them. Pain mostly related to sinus congestion- sudafed given. Continue cytotec po for cervical ripening.l   Labor: Progressing normally Fetal Wellbeing:  Category I Pain Control:  tylenol  I/D:  n/a Anticipated MOD:  NSVD  Christina Ingram 04/22/2015, 4:00 AM

## 2015-04-22 NOTE — Progress Notes (Addendum)
Labor Progress Note  S: Comfortable with ctx. Feeling some mild discomfort from FB. No cervical change since admission.   O:  BP 151/90 mmHg  Pulse 78  Temp(Src) 98.1 F (36.7 C) (Oral)  Resp 18  LMP 07/31/2014 (Approximate) FHR: baseline 145, moderate variability, +10x10 accels, no decels Toco: every 2-6 minutes  CVE:  Dilation: 1 Effacement (%): 60 Cervical Position: Anterior Station: -1 Presentation: Vertex Exam by:: clemmons, cnm  A&P: 19 y.o. G1P0 [redacted]w[redacted]d IOL 2/2 IUGR  #labor: first stage, FB still in place and pitocin at 58mu/min #ID: GBS neg #Anticipate NSVD   De Hollingshead, DO 9:16 PM  Seen and examined by me also FHR reactive, category I Irregular contractions Foley still in  Will continue to observe Aviva Signs, CNM

## 2015-04-22 NOTE — Progress Notes (Signed)
BRITTINI BRUBECK is a 19 y.o. G1P0 at [redacted]w[redacted]d by  admitted for IUGR  Subjective:  Comfortable with contractions. cytotec dc'ed. No cervical change since admission. Objective: BP 142/87 mmHg  Pulse 66  Temp(Src) 98.3 F (36.8 C) (Oral)  Resp 18  LMP 07/31/2014 (Approximate)      FHT:  Cat 1 UC:   irregular SVE:   Dilation: 1 Effacement (%): 60 Station: -1 Exam by:: [place] Doig, cnm  Labs: Lab Results  Component Value Date   WBC 7.1 04/21/2015   HGB 12.0 04/21/2015   HCT 35.3* 04/21/2015   MCV 88.7 04/21/2015   PLT 184 04/21/2015    Assessment / Plan: IUP @ 37+2  IUGR Foley Bulb placed without difficulty in cervix Start pitocin and run at 86mu/min   Labor: first stage Preeclampsia:   Fetal Wellbeing:  Category I Pain Control:   I/D:  GBS neg Anticipated MOD:  NSVD  Akina Maish Grissett 04/22/2015, 3:16 PM

## 2015-04-22 NOTE — Progress Notes (Cosign Needed)
Patient ID: Christina Ingram, female   DOB: 10/11/1995, 19 y.o.   MRN: [id number] Christina Ingram is a 19 y.o. G1P0 at [redacted]w[redacted]d admitted for induction of labor due to IUGR less than 3%.  Subjective: Pt has no complaints. Denies pain and ctx.   Objective: BP 140/86 mmHg  Pulse 68  Temp(Src) 98.3 F (36.8 C) (Oral)  Resp 18  LMP 07/31/2014 (Approximate)      FHT:  FHR: 135 bpm, variability: moderate,  accelerations:  Present,  decelerations:  Absent UC:   regular, every 3 minutes SVE:   Dilation: 1 Effacement (%): 70 Station: -1 Exam by:: hk  Labs: Lab Results  Component Value Date   WBC 7.1 04/21/2015   HGB 12.0 04/21/2015   HCT 35.3* 04/21/2015   MCV 88.7 04/21/2015   PLT 184 04/21/2015    Assessment / Plan: Induction of labor due to IUGR less than 3%,  progressing well on pitocin  Labor: Progressing normally s/p cytotec x 2 Preeclampsia:   Fetal Wellbeing:  Category I Pain Control:  Labor support without medications I/D:  n/a Anticipated MOD:  NSVD  Vuk Skillern O'Mara 04/22/2015, 2:29 PM

## 2015-04-23 ENCOUNTER — Encounter (HOSPITAL_COMMUNITY): Payer: Self-pay

## 2015-04-23 ENCOUNTER — Inpatient Hospital Stay (HOSPITAL_COMMUNITY): Payer: Medicaid Other | Admitting: Anesthesiology

## 2015-04-23 DIAGNOSIS — O99214 Obesity complicating childbirth: Secondary | ICD-10-CM

## 2015-04-23 DIAGNOSIS — O365930 Maternal care for other known or suspected poor fetal growth, third trimester, not applicable or unspecified: Secondary | ICD-10-CM

## 2015-04-23 DIAGNOSIS — Z3A37 37 weeks gestation of pregnancy: Secondary | ICD-10-CM

## 2015-04-23 MED ORDER — FENTANYL 2.5 MCG/ML BUPIVACAINE 1/10 % EPIDURAL INFUSION (WH - ANES)
14.0000 mL/h | INTRAMUSCULAR | Status: DC | PRN
  Administered 2015-04-23: 14 mL/h via EPIDURAL

## 2015-04-23 MED ORDER — ONDANSETRON HCL 4 MG/2ML IJ SOLN: 4.0000 mg | INTRAMUSCULAR | Status: DC | PRN

## 2015-04-23 MED ORDER — IBUPROFEN 600 MG PO TABS
600.0000 mg | ORAL_TABLET | Freq: Four times a day (QID) | ORAL | Status: DC
  Administered 2015-04-23 – 2015-04-25 (×10): 600 mg via ORAL
  Filled 2015-04-23 (×10): qty 1

## 2015-04-23 MED ORDER — FENTANYL 2.5 MCG/ML BUPIVACAINE 1/10 % EPIDURAL INFUSION (WH - ANES): INTRAMUSCULAR | Status: DC | PRN

## 2015-04-23 MED ORDER — LANOLIN HYDROUS EX OINT: TOPICAL_OINTMENT | CUTANEOUS | Status: DC | PRN

## 2015-04-23 MED ORDER — FENTANYL 2.5 MCG/ML BUPIVACAINE 1/10 % EPIDURAL INFUSION (WH - ANES): 14.0000 mL/h | INTRAMUSCULAR | Status: DC | PRN

## 2015-04-23 MED ORDER — TETANUS-DIPHTH-ACELL PERTUSSIS 5-2.5-18.5 LF-MCG/0.5 IM SUSP: 0.5000 mL | Freq: Once | INTRAMUSCULAR | Status: DC

## 2015-04-23 MED ORDER — ONDANSETRON HCL 4 MG PO TABS: 4.0000 mg | ORAL_TABLET | ORAL | Status: DC | PRN

## 2015-04-23 MED ORDER — FENTANYL 2.5 MCG/ML BUPIVACAINE 1/10 % EPIDURAL INFUSION (WH - ANES)
INTRAMUSCULAR | Status: DC | PRN
  Administered 2015-04-23: 14 mL/h via EPIDURAL

## 2015-04-23 MED ORDER — SENNOSIDES-DOCUSATE SODIUM 8.6-50 MG PO TABS
2.0000 | ORAL_TABLET | ORAL | Status: DC
  Administered 2015-04-23 – 2015-04-25 (×2): 2 via ORAL
  Filled 2015-04-23 (×2): qty 2

## 2015-04-23 MED ORDER — ZOLPIDEM TARTRATE 5 MG PO TABS: 5.0000 mg | ORAL_TABLET | Freq: Every evening | ORAL | Status: DC | PRN

## 2015-04-23 MED ORDER — PHENYLEPHRINE 40 MCG/ML (10ML) SYRINGE FOR IV PUSH (FOR BLOOD PRESSURE SUPPORT)
PREFILLED_SYRINGE | INTRAVENOUS | Status: AC
  Filled 2015-04-23: qty 20

## 2015-04-23 MED ORDER — DIPHENHYDRAMINE HCL 25 MG PO CAPS: 25.0000 mg | ORAL_CAPSULE | Freq: Four times a day (QID) | ORAL | Status: DC | PRN

## 2015-04-23 MED ORDER — PRENATAL MULTIVITAMIN CH
1.0000 | ORAL_TABLET | Freq: Every day | ORAL | Status: DC
  Administered 2015-04-23 – 2015-04-25 (×3): 1 via ORAL
  Filled 2015-04-23 (×3): qty 1

## 2015-04-23 MED ORDER — DIPHENHYDRAMINE HCL 50 MG/ML IJ SOLN: 12.5000 mg | INTRAMUSCULAR | Status: DC | PRN

## 2015-04-23 MED ORDER — WITCH HAZEL-GLYCERIN EX PADS: 1.0000 "application " | MEDICATED_PAD | CUTANEOUS | Status: DC | PRN

## 2015-04-23 MED ORDER — SIMETHICONE 80 MG PO CHEW: 80.0000 mg | CHEWABLE_TABLET | ORAL | Status: DC | PRN

## 2015-04-23 MED ORDER — LIDOCAINE HCL (PF) 1 % IJ SOLN
INTRAMUSCULAR | Status: DC | PRN
  Administered 2015-04-23: 3 mL

## 2015-04-23 MED ORDER — ACETAMINOPHEN 325 MG PO TABS
650.0000 mg | ORAL_TABLET | ORAL | Status: DC | PRN
  Administered 2015-04-23 – 2015-04-24 (×4): 650 mg via ORAL
  Filled 2015-04-23 (×4): qty 2

## 2015-04-23 MED ORDER — GUAIFENESIN-DM 100-10 MG/5ML PO SYRP
5.0000 mL | ORAL_SOLUTION | ORAL | Status: DC | PRN
  Administered 2015-04-24 (×3): 5 mL via ORAL
  Filled 2015-04-23 (×4): qty 5

## 2015-04-23 MED ORDER — PHENYLEPHRINE 40 MCG/ML (10ML) SYRINGE FOR IV PUSH (FOR BLOOD PRESSURE SUPPORT)
80.0000 ug | PREFILLED_SYRINGE | INTRAVENOUS | Status: DC | PRN
  Filled 2015-04-23: qty 2

## 2015-04-23 MED ORDER — DIBUCAINE 1 % RE OINT: 1.0000 "application " | TOPICAL_OINTMENT | RECTAL | Status: DC | PRN

## 2015-04-23 MED ORDER — FENTANYL 2.5 MCG/ML BUPIVACAINE 1/10 % EPIDURAL INFUSION (WH - ANES)
INTRAMUSCULAR | Status: AC
  Filled 2015-04-23: qty 125

## 2015-04-23 MED ORDER — EPHEDRINE 5 MG/ML INJ
10.0000 mg | INTRAVENOUS | Status: DC | PRN
  Filled 2015-04-23: qty 2

## 2015-04-23 MED ORDER — BENZOCAINE-MENTHOL 20-0.5 % EX AERO
1.0000 "application " | INHALATION_SPRAY | CUTANEOUS | Status: DC | PRN
  Administered 2015-04-23: 1 via TOPICAL
  Filled 2015-04-23: qty 56

## 2015-04-23 NOTE — Progress Notes (Signed)
CSW acknowledges NICU admission.    Patient screened out for psychosocial assessment since none of the following apply:  Psychosocial stressors documented in mother or baby's chart  Gestation less than 32 weeks  Code at delivery   Critically ill infant  Infant with anomalies  Please contact the Clinical Social Worker if specific needs arise, or by MOB's request.       

## 2015-04-23 NOTE — Anesthesia Procedure Notes (Signed)
Epidural Patient location during procedure: OB Start time: 04/23/2015 2:05 AM End time: 04/23/2015 2:22 AM  Staffing Anesthesiologist: Sebastian Ache  Preanesthetic Checklist Completed: patient identified, site marked, surgical consent, pre-op evaluation, timeout performed, IV checked, risks and benefits discussed and monitors and equipment checked  Epidural Patient position: sitting Prep: site prepped and draped and DuraPrep Patient monitoring: heart rate, continuous pulse ox and blood pressure Approach: midline Location: L2-L3 Injection technique: LOR air  Needle:  Needle type: Tuohy  Needle gauge: 17 G Needle length: 9 cm and 9 Needle insertion depth: 6 cm Catheter type: closed end flexible Catheter size: 19 Gauge Catheter at skin depth: 14 cm Test dose: negative  Assessment Events: blood not aspirated, injection not painful, no injection resistance, negative IV test and no paresthesia  Additional Notes   Patient tolerated the insertion well without complications.Reason for block:procedure for pain

## 2015-04-23 NOTE — Anesthesia Preprocedure Evaluation (Signed)
Anesthesia Evaluation  Patient identified by MRN, date of birth, ID band Patient awake and Patient confused    Reviewed: Allergy & Precautions, H&P , NPO status , Patient's Chart, lab work & pertinent test results  Airway Mallampati: II       Dental   Pulmonary  breath sounds clear to auscultation  Pulmonary exam normal       Cardiovascular Exercise Tolerance: Good Normal cardiovascular examRhythm:regular Rate:Normal     Neuro/Psych    GI/Hepatic Neg liver ROS,   Endo/Other  Morbid obesity  Renal/GU negative Renal ROS     Musculoskeletal   Abdominal   Peds  Hematology   Anesthesia Other Findings   Reproductive/Obstetrics (+) Pregnancy microsomia                             Anesthesia Physical Anesthesia Plan  ASA: II  Anesthesia Plan: Epidural   Post-op Pain Management:    Induction:   Airway Management Planned:   Additional Equipment:   Intra-op Plan:   Post-operative Plan:   Informed Consent: I have reviewed the patients History and Physical, chart, labs and discussed the procedure including the risks, benefits and alternatives for the proposed anesthesia with the patient or authorized representative who has indicated his/her understanding and acceptance.     Plan Discussed with:   Anesthesia Plan Comments:         Anesthesia Quick Evaluation

## 2015-04-23 NOTE — Progress Notes (Signed)
Labor Progress Note  S: Patient very uncomfortable and feeling a lot of pressure in pelvis   O:  BP 142/92 mmHg  Pulse 86  Temp(Src) 98 F (36.7 C) (Oral)  Resp 18  SpO2 98%  LMP 07/31/2014 (Approximate) Cat I  CVE: Dilation: 9 Effacement (%): 100 Cervical Position: Anterior Station: 0 Presentation: Vertex Exam by:: Walace  A&P: 19 y.o. G1P0 [redacted]w[redacted]d IOL 2/2 IUGR  #labor: AROM @ 315 and progressing very well  #anticipate NSVD  #  De Hollingshead, DO 3:22 AM

## 2015-04-23 NOTE — Progress Notes (Signed)
Labor Progress Note  S: uncomfortable with ctxs, planning on epidural  O:  BP 139/86 mmHg  Pulse 82  Temp(Src) 98.1 F (36.7 C) (Oral)  Resp 18  LMP 07/31/2014 (Approximate) Cat I: baseline 160 bpm, good variability, + accels, occ early decels UC every  2 min  CVE: Dilation: 6 Effacement (%): 100 Cervical Position: Anterior Station: 0 Presentation: Vertex Exam by:: Foye Clock RN  A&P: 19 y.o. G1P0 [redacted]w[redacted]d IOL 2/2 IUGR #labor: s/p FB, continue to increase pitocin as tolerated  #pain: epidural upon request #anticipate NSVD   De Hollingshead, DO 2:16 AM

## 2015-04-24 DIAGNOSIS — O365930 Maternal care for other known or suspected poor fetal growth, third trimester, not applicable or unspecified: Secondary | ICD-10-CM

## 2015-04-24 DIAGNOSIS — Z3A37 37 weeks gestation of pregnancy: Secondary | ICD-10-CM

## 2015-04-24 DIAGNOSIS — O99214 Obesity complicating childbirth: Secondary | ICD-10-CM

## 2015-04-24 NOTE — Progress Notes (Signed)
Gave patient incentive spirometer. She states she has had long lasting cold symptoms since during pregnancy. Has brought up copious amounts of thick mucous. Will continue to monitor

## 2015-04-24 NOTE — Anesthesia Postprocedure Evaluation (Signed)
Anesthesia Post Note  Patient: Christina Ingram  Procedure(s) Performed: * No procedures listed *  Anesthesia type: Epidural  Patient location: Mother/Baby  Post pain: Pain level controlled  Post assessment: Post-op Vital signs reviewed  Last Vitals:  Filed Vitals:   04/24/15 0539  BP: 137/84  Pulse: 69  Temp: 36.5 C  Resp: 15    Post vital signs: Reviewed  Level of consciousness:alert  Complications: No apparent anesthesia complications

## 2015-04-24 NOTE — Progress Notes (Addendum)
Post Partum Day 1 Subjective:  Christina Ingram is a 19 y.o. G1P1001 [redacted]w[redacted]d s/p SVD.  No acute events overnight.  Pt has no problems with ambulating, voiding or po intake.  She denies nausea or vomiting.  Pain is moderately controlled.  She has had flatus. She has not had bowel movement.  Lochia Moderate.  Plan for birth control is Seem interested in nexplanon, told her about how it is placed and she wouldn't have to worry about taking a pill daily..  Method of Feeding: Baby is formula.  Objective: Blood pressure 137/84, pulse 69, temperature 97.7 F (36.5 C), temperature source Oral, resp. rate 15, height  (1.626 m), weight 89.812 kg (198 lb), last menstrual period 07/31/2014, SpO2 99 %, unknown if currently breastfeeding.  Physical Exam:  General: alert, cooperative and no distress Lochia:normal flow Chest: CTAB Heart: RRR no m/r/g Abdomen: +BS, soft, nontender,  Uterine Fundus: firm DVT Evaluation: No evidence of DVT seen on physical exam. Extremities: some edema in both feet bilaterally.  ROS: Denies any nausea, vomiting, dizziness, fever, headache or chest pain Endorses some shortness of breath and seeing floaters.   Recent Labs  04/21/15 1424  HGB 12.0  HCT 35.3*    Assessment/Plan:  ASSESSMENT: Christina Ingram is a 19 y.o. G1P1001 [redacted]w[redacted]d s/p SVD after IOL for IUGR  Plan: #Elevated BP: Continue to monitor her blood pressure, she has had a few elevated readings around the 140/90's and her BP has ranged from 124/81-152/95. Consider medication is in severe range . #Contraception: Nexplanon is covered by Medicaid- continue to address with patient  Plan for discharge tomorrow   LOS: 4 days   Kandyce Rud. 04/24/2015, 9:35 AM   OB fellow attestation Post Partum Day 1 I have seen and examined this patient and agree with above documentation in the medical student's note and have updated/edited and reviewed that documentation fully.   Christina Ingram is a  19 y.o. G1P1001 s/p NSVD.  Pt denies problems with ambulating, voiding or po intake. Pain is well controlled.  Plan for birth control is considering Nexplanon.  Method of Feeding:Formula  PE:  BP 144/92 mmHg  Pulse 77  Temp(Src) 97.7 F (36.5 C) (Oral)  Resp 18  Ht  (1.626 m)  Wt 198 lb (89.812 kg)  BMI 33.97 kg/m2  SpO2 100%  LMP 07/31/2014 (Approximate)  Breastfeeding? Unknown   BP Readings from Last 3 Encounters:  04/24/15 144/92  04/19/15 110/70  04/16/15 144/82   Fundus firm  Plan for discharge: PPD#2. Follow up TORCH titers given IUGR. Monitor B  Federico Flake, MD 3:03 PM

## 2015-04-24 NOTE — Clinical Social Work Maternal (Signed)
CLINICAL SOCIAL WORK MATERNAL/CHILD NOTE  Patient Details  Name: Christina Ingram MRN: [id number] Date of Birth: 08/15/1996  Date:  08/05/2015  Clinical Social Worker Initiating Note:  Lucita Ferrara, LCSW Date/ Time Initiated:  04/24/15/1300     Child's Name:  Christina Ingram   Legal Guardian:  Ouida Sills and Lesia Sago  Need for Interpreter:  None   Date of Referral:  May 07, 2015     Reason for Referral:  NICU Referral Source:  NICU   Address:  [address] [place], [place] [address]  Phone number:  [phone]   Household Members: Father, sister (FOB at separate residence in [place])  Natural Supports (not living in the home):  Extended Family, Immediate Family   Professional Supports: None   Employment: Unemployed   Type of Work:     Education:      Pensions consultant:  [employer], SSI/Disability(for learning disability)   Other Resources:  Physicist, medical , Green Considerations Which May Impact Care:  None reported  Strengths:  Ability to meet basic needs , Home prepared for child    Risk Factors/Current Problems:   1)Mental Health Concerns: MOB presents with depressive symptoms during the pregnancy. MOB expressed interest in exploring medication options at the hospital with subsequent follow up with a mental health provider postpartum.  2)Family/Relationship Issues: Ongoing relational stress with the FOB.   Cognitive State:  Alert , Distractible , Tangential   Mood/Affect:  Tearful , Sad    CSW Assessment:  CSW met with MOB due to NICU admission and infant qualifying for SSI.  MOB presented as tearful when CSW arrived.  MOB requested that FOB leave the room since MOB reported relational stress with FOB secondary to disagreements related to what to name the infant.  FOB willingly left, but attempted to return numerous times.  Each time, CSW continued to request additional time with the MOB, and FOB would eventually leave.  MOB  presented as tearful during the visit.  She was noted to be smiling when she discussed her newborn, and how she feels as she becomes a mother.  MOB often had a difficult time engaging in a linear conversation as she was easily distracted. MOB stated that she did have a migraine, and shared that it was difficult for her to concentrate.  MOB responded well to re-direction.   Per MOB, there is current relational stress with the FOB since she wanted the infant to have her last name, and the FOB refused to sign the birth certificate unless she had his name.  MOB discussed how she eventually decided to allow the infant to have his last name, but is now upset and regrets this decision. She shared that the birth certificate has already been sent, and that there is nothing else that can be done.  CSW continued to provide support as MOB reflected upon her perceptions of the lack of support she has received from the FOB during the pregnancy since he questioned paternity and frequently called her names secondary to her learning disabilities.  She shared that she had hoped that he would become more involved if he were to sign the birth certificate, but is recognizing that his name being listed is not going to change his behaviors.  MOB presents with increasing sense of insight related to how her relationship with him has negatively impacted her mental health.  She recognizes potential ongoing stressors if he continues to be involved, and was receptive to exploring potential benefit of establishing  boundaries with him postpartum.  MOB shared that she will "thinking to do" in order to assist her come up with a plan.    Per MOB, she felt depressed during the pregnancy due to the relational stress with the FOB. She discussed frequently crying, not wanting to get out of bed, and desire to isolate. She shared that she frequently felt hopeless and helpless related to her circumstance and her life improving.  MOB discussed that she  still engaged in activities of daily living and denied SI; however, she shared belief that she was depressed. MOB shared that she never informed anyone about how she was feeling out of fear of being classified as "crazy".  She expressed how she is tired of feeling depressed and is receptive to receiving help for her symptoms.  MOB expressed interest in CSW offer to speak with faculty practice regarding starting an antidepressant.  MOB also receptive to ongoing mental health support postpartum.  CSW provided increased risk for PPD due to her mental health symptoms during the pregnancy, psychosocial stressors, and the infant's NICU admission.    CSW provided information on how to apply for SSI on behalf of infant due to her weight at birth. MOB signed ROI, CSW provided MOB with copy of infant's H&P.   MOB denied additional questions or concerns.   MOB discussed normative range of emotions associated with NICU admission. She stated that she enjoys holding and caring for the infant in the NICU, but is already anticipating a difficult separation from the infant when she is discharged.  MOB discussed goal of focusing on short term separation, and shared that she is hopeful that the admission will be brief.  Per MOB, she lives approximately 30 minutes from the hospital, and she has her sister who will help her travel back and forth from the hospital.  MOB stated that she intends to remain in constant contact with the care team to receive updates on the infant's health.   MOB expressed appreciation for the support.  She discussed feeling better after talking about her feelings.  MOB discussed awareness of activities that may assist her to focus on "moving forward" instead of dwelling on the past. She discussed goal of sleeping, taking a shower, and visiting with the infant this afternoon in order to feel better.   After assessment, CSW contact faculty practice and asked if MOB can be evaluated for an antidepressant  due to symptoms during the pregnancy.  Faculty practice to follow up with MOB.   CSW Plan/Description:   1)Patient/Family Education: Perinatal mood and anxiety disorders 2) Information/Referral to [employer]: SSI for infant, outpatient mental health   3)No Further Intervention Required/No Barriers to Discharge; however, CSW will follow up with MOB during NICU admission to provide ongoing support PRN.    Sheilah Mins, LCSW 04/24/2015, 3:12 PM

## 2015-04-24 NOTE — Progress Notes (Signed)
Christina Ingram was lying in bed when I arrived; a guest was bedside but he did not exchange introductions. Pt said she was doing ok and when asked if I could assist she only said she needed pads/panties. Shared info w/nurse assistant.  Please page if additional support is needed. Christina Ingram Chaplain   04/24/15 1500  Clinical Encounter Type  Visited With Patient;Patient and family together

## 2015-04-24 NOTE — Lactation Note (Signed)
This note was copied from the chart of Christina Ingram. Lactation Consultation Note  Patient Name: Christina Ingram Today's Date: 04/24/2015 Reason for consult: Initial assessment;NICU baby NICU baby 95 hours old, [redacted]w[redacted]d GA. Mom states that she was too tired to pump through the night. Enc mom to pump 8 times a day for 15 minutes. Mom states that she has been able to hand express colostrum. Enc mom to take colostrum to baby as she is able. Also enc offering STS/Kangaroo care as baby able. Discussed normal progression of milk coming to full volume. Mom give NICU booklet with review. Mom aware of pumping rooms in NICU. Mom enc to call [facility] office for appointment and to see about getting a DEBP. Mom given [facility] brochure, aware of OP/BFSG, community resources, and [facility] phone line assistance after D/C.   Maternal Data Has patient been taught Hand Expression?: Yes (Per mom.) Does the patient have breastfeeding experience prior to this delivery?: No  Feeding Feeding Type: Formula Nipple Type: Slow - flow Length of feed: 20 min  LATCH Score/Interventions                      Lactation Tools Discussed/Used WIC Program: Yes Pump Review: Milk Storage;Setup, frequency, and cleaning Initiated by:: JW Date initiated:: 04/23/15   Consult Status Consult Status: Follow-up Date: 04/25/15 Follow-up type: In-patient    Christina Ingram, Christina Ingram 04/24/2015, 10:00 AM

## 2015-04-25 LAB — TORCH-IGM(TOXO/ RUB/ CMV/ HSV) W TITER
Rubella IgM: <20 AU/mL (ref 0.0–19.9)
Toxoplasma Antibody- IgM: <3 AU/mL (ref 0.0–7.9)

## 2015-04-25 LAB — HSV 2 ANTIBODY, IGG: HSV 2 Glycoprotein G Ab, IgG: <0.91 index (ref 0.00–0.90)

## 2015-04-25 LAB — TOXOPLASMA GONDII ANTIBODY, IGG

## 2015-04-25 LAB — CMV ANTIBODY, IGG (EIA): CMV AB - IGG: 1.7 U/mL — AB (ref 0.00–0.59)

## 2015-04-25 LAB — INFECT DISEASE AB IGM REFLEX 1

## 2015-04-25 LAB — HSV 1 ANTIBODY, IGG: HSV 1 GLYCOPROTEIN G AB, IGG: 21 {index} — AB (ref 0.00–0.90)

## 2015-04-25 MED ORDER — ACETAMINOPHEN-CODEINE #3 300-30 MG PO TABS: 2.0000 | ORAL_TABLET | ORAL | Status: DC | PRN

## 2015-04-25 MED ORDER — ESCITALOPRAM OXALATE 10 MG PO TABS: 10.0000 mg | ORAL_TABLET | Freq: Every day | ORAL | Status: DC

## 2015-04-25 NOTE — Discharge Instructions (Signed)

## 2015-04-25 NOTE — Progress Notes (Signed)
Pt ambulated out  Teaching complete 

## 2015-04-25 NOTE — Discharge Summary (Signed)
Obstetric Discharge Summary Reason for Admission: induction of labor Christina Ingram is a 19 y.o. female presenting for IOL for IUGR less than 3 %. GBS neg Prenatal Procedures: ultrasound Intrapartum Procedures: spontaneous vaginal delivery Postpartum Procedures: begin antidepressant due to learning disability and stressors , [place], and will visit baby prn. Complications-Operative and Postpartum: none short interval pp visit to assess mental status and REFER TO [facility] FOR COUNSELING.(OR [facility]) HEMOGLOBIN  Date Value Ref Range Status  04/21/2015 12.0 12.0 - 15.0 g/dL Final   HCT  Date Value Ref Range Status  04/21/2015 35.3* 36.0 - 46.0 % Final   HEMATOCRIT  Date Value Ref Range Status  02/19/2015 38.8 34.0 - 46.6 % Final    Physical Exam:  General: alert, cooperative and no distress Lochia: appropriate Uterine Fundus: firm Incision:  DVT Evaluation: No evidence of DVT seen on physical exam.  Discharge Diagnoses: Term Pregnancy-delivered and IUGR BABY  Discharge Information:ON LEXAPRO,10 DAILY  Date: 04/25/2015 Activity: pelvic rest Diet: routine Medications: PNV, Tylenol #3 and LEXAPRO Condition: stable Instructions: 1 WEEK VISIT TO fFTOB AND REFERRRAL TO MENTAL HEALTH Discharge to: home   Newborn Data: Live born female  Birth Weight: 4 lb 2.3 oz (1880 g) APGAR: 4, 9  Home with IN nNICU .  Jayvyn Haselton V 04/25/2015, 7:52 AM

## 2015-04-25 NOTE — Lactation Note (Signed)
This note was copied from the chart of Christina Ingram. Lactation Consultation Note  Follow up visit made prior to discharge.  Mom is pumping and hand expressing every three hours and obtaining a few mls of colostrum.  She called [facility] and will be able to pick up a pump today.  Encouraged to call with concerns prn.  Patient Name: Christina Chanita Boden Today's Date: 04/25/2015     Maternal Data    Feeding Feeding Type: Formula Nipple Type: Slow - flow Length of feed: 30 min  LATCH Score/Interventions                      Lactation Tools Discussed/Used     Consult Status      Huston Foley 04/25/2015, 9:40 AM

## 2015-04-26 ENCOUNTER — Ambulatory Visit: Payer: Self-pay

## 2015-04-26 NOTE — Lactation Note (Signed)
This note was copied from the chart of Christina Ingram. Lactation Consultation Note  Patient Name: Christina Ingram Today's Date: 04/26/2015 Reason for consult: Follow-up assessment;NICU baby  NICU baby 67 hours old. Mom states that she was not able to pick up her [facility] pump yesterday, so she only pumped once yesterday. Mom able to get her pump today and wanting help pumping. Showed mom the pumping rooms, and how to clean her pump parts. Mom had lots of questions, which were all answered. Performed "teach-back" for pertinent pumping knowledge such as: pumping 8 times in 24 hours for 15 minutes, followed by hand expression. Enc mom to pump while in NICU and leave milk with baby's RN. Discussed engorgement prevention/treatment. Enc massage and hand expression prior to and following pumping. Mom has lots of colostrum/transitional milk--over from each breast. Discussed normal progression of milk coming to volume and the importance of pumping regularly--8 times each day. Mom aware of OP/BFSG and [facility] phone line assistance after D/C.  Maternal Data    Feeding Feeding Type: Formula Length of feed: 30 min  LATCH Score/Interventions                      Lactation Tools Discussed/Used     Consult Status Consult Status: PRN    Geralynn Ochs 04/26/2015, 6:12 PM

## 2015-05-18 LAB — US OB FOLLOW UP

## 2015-05-28 ENCOUNTER — Ambulatory Visit (INDEPENDENT_AMBULATORY_CARE_PROVIDER_SITE_OTHER): Payer: Medicaid Other | Admitting: Women's Health

## 2015-05-28 ENCOUNTER — Encounter: Payer: Self-pay | Admitting: Women's Health

## 2015-05-28 DIAGNOSIS — F53 Postpartum depression: Secondary | ICD-10-CM

## 2015-05-28 DIAGNOSIS — O99345 Other mental disorders complicating the puerperium: Secondary | ICD-10-CM

## 2015-05-28 DIAGNOSIS — R102 Pelvic and perineal pain: Secondary | ICD-10-CM

## 2015-05-28 NOTE — Patient Instructions (Signed)
NO SEX UNTIL AFTER YOU GET YOUR BIRTH CONTROL   Constipation  Drink plenty of fluid, preferably water, throughout the day  Eat foods high in fiber such as fruits, vegetables, and grains  Exercise, such as walking, is a good way to keep your bowels regular  Drink warm fluids, especially warm prune juice, or decaf coffee  Eat a 1/2 cup of real oatmeal (not instant), 1/2 cup applesauce, and 1/2-1 cup warm prune juice every day  If needed, you may take Colace (docusate sodium) stool softener once or twice a day to help keep the stool soft. If you are pregnant, wait until you are out of your first trimester (12-14 weeks of pregnancy)  If you still are having problems with constipation, you may take Miralax once daily as needed to help keep your bowels regular.  If you are pregnant, wait until you are out of your first trimester (12-14 weeks of pregnancy)   Cramps: ibuprofen, warm showers, heating pad- no longer than 20 minutes at a time  Etonogestrel implant What is this medicine? ETONOGESTREL (et oh noe JES trel) is a contraceptive (birth control) device. It is used to prevent pregnancy. It can be used for up to 3 years. This medicine may be used for other purposes; ask your health care provider or pharmacist if you have questions. COMMON BRAND NAME(S): Implanon, Nexplanon What should I tell my health care provider before I take this medicine? They need to know if you have any of these conditions: -abnormal vaginal bleeding -blood vessel disease or blood clots -cancer of the breast, cervix, or liver -depression -diabetes -gallbladder disease -headaches -heart disease or recent heart attack -high blood pressure -high cholesterol -kidney disease -liver disease -renal disease -seizures -tobacco smoker -an unusual or allergic reaction to etonogestrel, other hormones, anesthetics or antiseptics, medicines, foods, dyes, or preservatives -pregnant or trying to get  pregnant -breast-feeding How should I use this medicine? This device is inserted just under the skin on the inner side of your upper arm by a health care professional. Talk to your pediatrician regarding the use of this medicine in children. Special care may be needed. Overdosage: If you think you've taken too much of this medicine contact a poison control center or emergency room at once. Overdosage: If you think you have taken too much of this medicine contact a poison control center or emergency room at once. NOTE: This medicine is only for you. Do not share this medicine with others. What if I miss a dose? This does not apply. What may interact with this medicine? Do not take this medicine with any of the following medications: -amprenavir -bosentan -fosamprenavir This medicine may also interact with the following medications: -barbiturate medicines for inducing sleep or treating seizures -certain medicines for fungal infections like ketoconazole and itraconazole -griseofulvin -medicines to treat seizures like carbamazepine, felbamate, oxcarbazepine, phenytoin, topiramate -modafinil -phenylbutazone -rifampin -some medicines to treat HIV infection like atazanavir, indinavir, lopinavir, nelfinavir, tipranavir, ritonavir -St. John's wort This list may not describe all possible interactions. Give your health care provider a list of all the medicines, herbs, non-prescription drugs, or dietary supplements you use. Also tell them if you smoke, drink alcohol, or use illegal drugs. Some items may interact with your medicine. What should I watch for while using this medicine? This product does not protect you against HIV infection (AIDS) or other sexually transmitted diseases. You should be able to feel the implant by pressing your fingertips over the skin where it was inserted.  Tell your doctor if you cannot feel the implant. What side effects may I notice from receiving this medicine? Side  effects that you should report to your doctor or health care professional as soon as possible: -allergic reactions like skin rash, itching or hives, swelling of the face, lips, or tongue -breast lumps -changes in vision -confusion, trouble speaking or understanding -dark urine -depressed mood -general ill feeling or flu-like symptoms -light-colored stools -loss of appetite, nausea -right upper belly pain -severe headaches -severe pain, swelling, or tenderness in the abdomen -shortness of breath, chest pain, swelling in a leg -signs of pregnancy -sudden numbness or weakness of the face, arm or leg -trouble walking, dizziness, loss of balance or coordination -unusual vaginal bleeding, discharge -unusually weak or tired -yellowing of the eyes or skin Side effects that usually do not require medical attention (Report these to your doctor or health care professional if they continue or are bothersome.): -acne -breast pain -changes in weight -cough -fever or chills -headache -irregular menstrual bleeding -itching, burning, and vaginal discharge -pain or difficulty passing urine -sore throat This list may not describe all possible side effects. Call your doctor for medical advice about side effects. You may report side effects to FDA at 1-800-FDA-1088. Where should I keep my medicine? This drug is given in a hospital or clinic and will not be stored at home. NOTE: This sheet is a summary. It may not cover all possible information. If you have questions about this medicine, talk to your doctor, pharmacist, or health care provider.  2015, Elsevier/Gold Standard. (2012-03-21 15:37:45)

## 2015-05-28 NOTE — Progress Notes (Signed)
Patient ID: Christina Ingram, female   DOB: 1996/05/07, 19 y.o.   MRN: [id number] Subjective:    Christina Ingram is a 19 y.o. [id number] African American female who presents for a postpartum visit. She is 4 weeks postpartum following a spontaneous vaginal delivery at 37.3 gestational weeks after IOL for IUGR. Anesthesia: epidural. I have fully reviewed the prenatal and intrapartum course. Postpartum course has been complicated by cramping and occ sharp pains in vagina, some cramping when voiding. Baby's course has been complicated by ~1wk nicu stay for IUGR. Baby is feeding by breast. Bleeding thin lochia. Bowel function is constipation. Bladder function is normal. Patient is not sexually active. Last sexual activity: prior to birth of baby. Contraception method is wants nexplanon. Postpartum depression screening: positive. Score 14. Eating well, sleeping well- doesn't really find joy in things she used to. Denies SI/HI/II. Feels like she was depressed during pregnancy too. Was rx'd lexapro  at d/c from hospital- was scared to start taking b/c of potential side effects but does feel like she needs to be on medication. Also wants counseling.  Last pap never, just turned 19yo, needs pap.  The following portions of the patient's history were reviewed and updated as appropriate: allergies, current medications, past medical history, past surgical history and problem list.  Review of Systems Pertinent items are noted in HPI.   Filed Vitals:   05/28/15 1356  BP: 108/70  Pulse: 76  Weight: 178 lb (80.74 kg)   No LMP recorded.  Objective:   General:  alert, cooperative and no distress   Breasts:  deferred, no complaints  Lungs: clear to auscultation bilaterally  Heart:  regular rate and rhythm  Abdomen: soft, nontender   Vulva: normal  Vagina: normal vagina  Cervix:  closed  Corpus: Well-involuted  Adnexa:  Non-palpable  Rectal Exam: No hemorrhoids        Urine dipstick: neg Assessment:    Postpartum exam 4 wks s/p SVB after IOL for IUGR Breastfeeding Depression screening PPD Contraception counseling   Plan:  Pick up rx for lexapro  daily previously rx'd by [facility] and begin today- knows can take 3-4wks before notices any improvement Referral to [facility] faxed today Contraception: abstinence until nexplanon placement- order today Follow up in: 3 weeks for nexplanon insertion, then 4wks for pap & physical and f/u on lexapro or earlier if needed Warm showers, ibuprofen, heating pads prn cramping  Marge Duncans CNM, [facility] 05/28/2015 2:20 PM

## 2015-06-01 ENCOUNTER — Emergency Department (HOSPITAL_COMMUNITY)
Admission: EM | Admit: 2015-06-01 | Discharge: 2015-06-01 | Disposition: A | Discharge status: Home / Self Care | Payer: Medicaid Other | Attending: Emergency Medicine | Admitting: Emergency Medicine

## 2015-06-01 ENCOUNTER — Encounter (HOSPITAL_COMMUNITY): Payer: Self-pay | Admitting: Emergency Medicine

## 2015-06-01 DIAGNOSIS — Z79899 Other long term (current) drug therapy: Secondary | ICD-10-CM | POA: Insufficient documentation

## 2015-06-01 DIAGNOSIS — Z880 Allergy status to penicillin: Secondary | ICD-10-CM | POA: Insufficient documentation

## 2015-06-01 DIAGNOSIS — R21 Rash and other nonspecific skin eruption: Secondary | ICD-10-CM | POA: Insufficient documentation

## 2015-06-01 MED ORDER — PREDNISONE 50 MG PO TABS
60.0000 mg | ORAL_TABLET | Freq: Once | ORAL | Status: AC
  Administered 2015-06-01: 60 mg via ORAL
  Filled 2015-06-01 (×2): qty 1

## 2015-06-01 MED ORDER — HYDROXYZINE HCL 25 MG PO TABS: 25.0000 mg | ORAL_TABLET | Freq: Four times a day (QID) | ORAL | Status: DC

## 2015-06-01 MED ORDER — FAMOTIDINE 20 MG PO TABS: 20.0000 mg | ORAL_TABLET | Freq: Two times a day (BID) | ORAL | Status: DC

## 2015-06-01 MED ORDER — PREDNISONE 10 MG PO TABS: 20.0000 mg | ORAL_TABLET | Freq: Two times a day (BID) | ORAL | Status: DC

## 2015-06-01 MED ORDER — FAMOTIDINE 20 MG PO TABS
20.0000 mg | ORAL_TABLET | Freq: Once | ORAL | Status: AC
  Administered 2015-06-01: 20 mg via ORAL
  Filled 2015-06-01: qty 1

## 2015-06-01 NOTE — ED Notes (Signed)
Pt made aware to return if symptoms worsen or if any life threatening symptoms occur.   

## 2015-06-01 NOTE — ED Notes (Signed)
Pt reports generalized rash. Minor swelling on left hand. Pt c/o itching and burning. Pt denies extended periods of exposure to the outdoors.

## 2015-06-01 NOTE — ED Provider Notes (Signed)
CSN: [id number]     Arrival date & time 06/01/15  1634 History   First MD Initiated Contact with Patient 06/01/15 1647     Chief Complaint  Patient presents with  . Rash     (Consider location/radiation/quality/duration/timing/severity/associated sxs/prior Treatment) Patient is a 19 y.o. female presenting with rash. The history is provided by the patient.  Rash Location:  Full body Quality: itchiness and redness   Severity:  Moderate Onset quality:  Gradual Duration:  1 day Timing:  Constant Progression:  Worsening Chronicity:  New Relieved by:  None tried Worsened by:  Heat Ineffective treatments:  None tried  Christina Ingram is a 19 y.o. female who presents to the ED with a rash. The rash started yesterday. She complains of severe itching. Patient denies new lotions, detergent, clothes, soap or other changes. She denies any other problems.  Past Medical History  Diagnosis Date  . Medical history non-contributory    Past Surgical History  Procedure Laterality Date  . Wisdom tooth extraction     Family History  Problem Relation Age of Onset  . Cancer Mother   . Clotting disorder Mother   . Venous thrombosis Mother   . Hypertension Mother   . Diabetes Maternal Grandmother   . Hypertension Maternal Grandmother   . Diabetes Maternal Grandfather    Social History  Substance Use Topics  . Smoking status: Never Smoker   . Smokeless tobacco: Never Used  . Alcohol Use: No   OB History    Gravida Para Term Preterm AB TAB SAB Ectopic Multiple Living   0 1     Review of Systems  Skin: Positive for rash.  all other systems negative    Allergies  Penicillins  Home Medications   Prior to Admission medications   Medication Sig Start Date End Date Taking? Authorizing Provider  prenatal vitamin w/FE, FA (PRENATAL 1 + 1) 27-1 MG TABS tablet Take 1 tablet by mouth daily at 12 noon. 11/08/14  Yes Tilda Burrow, MD  famotidine (PEPCID) 20 MG tablet Take 1  tablet (20 mg total) by mouth 2 (two) times daily. 06/01/15   Hope Orlene Och, NP  hydrOXYzine (ATARAX/VISTARIL) 25 MG tablet Take 1 tablet (25 mg total) by mouth every 6 (six) hours. 06/01/15   Hope Orlene Och, NP  predniSONE (DELTASONE) 10 MG tablet Take 2 tablets (20 mg total) by mouth 2 (two) times daily with a meal. 06/01/15   Hope Orlene Och, NP   BP 126/77 mmHg  Pulse 85  Temp(Src) 98.2 F (36.8 C) (Oral)  Resp 18  Ht 5' 3.75" (1.619 m)  Wt 180 lb (81.647 kg)  BMI 31.15 kg/m2  SpO2 100% Physical Exam  Constitutional: She is oriented to person, place, and time. She appears well-developed and well-nourished. No distress.  HENT:  Head: Normocephalic and atraumatic.  Mouth/Throat: Uvula is midline, oropharynx is clear and moist and mucous membranes are normal.  Eyes: Conjunctivae and EOM are normal.  Neck: Neck supple.  Cardiovascular: Normal rate and regular rhythm.   Pulmonary/Chest: Effort normal and breath sounds normal.  Abdominal: There is no tenderness.  Musculoskeletal: Normal range of motion.  See skin exam  Neurological: She is alert and oriented to person, place, and time. No cranial nerve deficit.  Skin: Skin is warm and dry.  Raised, red, hive like areas to the dorsum of the arms, lower back, lower legs and forehead.   Psychiatric: She has  a normal mood and affect. Her behavior is normal.  Nursing note and vitals reviewed.   ED Course  Procedures   MDM  19 y.o. female with rash and itching x 24 hours. Stable for d/c without respiratory symptoms, no difficulty swallowing or swelling of throat noted. Discussed with the patient and all questioned fully answered. She will follow up with dermatology or return here if any problems arise. Will treat with Prednisone, Atarax and Pepcid.   Final diagnoses:  Rash and nonspecific skin eruption       Janne Napoleon, NP 06/01/15 1715  Samuel Jester, DO 06/04/15 1756

## 2015-06-18 ENCOUNTER — Encounter: Payer: Self-pay | Admitting: Women's Health

## 2015-06-18 ENCOUNTER — Ambulatory Visit (INDEPENDENT_AMBULATORY_CARE_PROVIDER_SITE_OTHER): Payer: Medicaid Other | Admitting: Women's Health

## 2015-06-18 VITALS — BP 124/70 | HR 72 | Wt 183.0 lb

## 2015-06-18 DIAGNOSIS — F32A Depression, unspecified: Secondary | ICD-10-CM

## 2015-06-18 DIAGNOSIS — Z30017 Encounter for initial prescription of implantable subdermal contraceptive: Secondary | ICD-10-CM

## 2015-06-18 DIAGNOSIS — Z3202 Encounter for pregnancy test, result negative: Secondary | ICD-10-CM

## 2015-06-18 DIAGNOSIS — F329 Major depressive disorder, single episode, unspecified: Secondary | ICD-10-CM

## 2015-06-18 DIAGNOSIS — Z308 Encounter for other contraceptive management: Secondary | ICD-10-CM

## 2015-06-18 LAB — POCT URINE PREGNANCY: Preg Test, Ur: NEGATIVE

## 2015-06-18 MED ORDER — ESCITALOPRAM OXALATE 10 MG PO TABS: 10.0000 mg | ORAL_TABLET | Freq: Every day | ORAL | Status: DC

## 2015-06-18 NOTE — Addendum Note (Signed)
Addended by: Gaylyn Rong A on: 06/18/2015 04:12 PM   Modules accepted: Orders

## 2015-06-18 NOTE — Progress Notes (Signed)
Patient ID: Christina Ingram, female   DOB: 06/07/1996, 20 y.o.   MRN: [id number] [name] is a 19 y.o. year old [place] American female here for Nexplanon insertion.  Patient's last menstrual period was 06/13/2015., last sexual intercourse was prior to birth of baby 2 months ago, she is on her period now, and her pregnancy test today was negative.  Risks/benefits/side effects of Nexplanon have been discussed and her questions have been answered.  Specifically, a failure rate of 09/998 has been reported, with an increased failure rate if pt takes St. John's Wort and/or antiseizure medicaitons.  AZANI BROGDON is aware of the common side effect of irregular bleeding, which the incidence of decreases over time. She has not begun taking lexapro  daily as rx'd or gone to [facility] for counseling b/c she has not had a ride. She walks to her appts here. Offered to send rx to [facility] so she could walk there to get- accepted, so rx lexapro  daily sent. Discussed possibility of nexplanon making depression worse and if this happens let us know asap.  BP 124/70 mmHg  Pulse 72  Wt 83.008 kg (183 lb)  LMP 06/13/2015  Breastfeeding? No  No results found for this or any previous visit (from the past 24 hour(s)).   She is right-handed, so her left arm, approximately 4 inches proximal from the elbow, was cleansed with alcohol and anesthetized with 2cc of 2% Lidocaine.  The area was cleansed again with betadine and the Nexplanon was inserted per manufacturer's recommendations without difficulty.  A steri-strip and pressure bandage were applied.  Pt was instructed to keep the area clean and dry, remove pressure bandage in 24 hours, and keep insertion site covered with the steri-strip for 3-5 days.  Back up contraception was recommended for 2 weeks.  She was given a card indicating date Nexplanon was inserted and date it needs to be removed.   Is supposed to be moving in near future, unsure of to  where, states it depends on where her mom wants to go. Has appt w/ [place] on 10/18 for pap & physical, if unable to make it to [place], she is to reschedule w/ someone closer to where she moves for pap & physical, lexapro/depression/nexplanon f/u.    Marge Duncans CNM, [facility] 06/18/2015 3:50 PM

## 2015-06-18 NOTE — Patient Instructions (Signed)
Keep the area clean and dry.  You can remove the big bandage in 24 hours, and the small steri-strip bandage in 3-5 days.  A back up method, such as condoms, should be used for two weeks. You may have irregular vaginal bleeding for the first 6 months after the Nexplanon is placed, then the bleeding usually lightens and it is possible that you may not have any periods.  If you have any concerns, please give us a call.    Etonogestrel implant What is this medicine? ETONOGESTREL (et oh noe JES trel) is a contraceptive (birth control) device. It is used to prevent pregnancy. It can be used for up to 3 years. This medicine may be used for other purposes; ask your health care provider or pharmacist if you have questions. COMMON BRAND NAME(S): Implanon, Nexplanon What should I tell my health care provider before I take this medicine? They need to know if you have any of these conditions: -abnormal vaginal bleeding -blood vessel disease or blood clots -cancer of the breast, cervix, or liver -depression -diabetes -gallbladder disease -headaches -heart disease or recent heart attack -high blood pressure -high cholesterol -kidney disease -liver disease -renal disease -seizures -tobacco smoker -an unusual or allergic reaction to etonogestrel, other hormones, anesthetics or antiseptics, medicines, foods, dyes, or preservatives -pregnant or trying to get pregnant -breast-feeding How should I use this medicine? This device is inserted just under the skin on the inner side of your upper arm by a health care professional. Talk to your pediatrician regarding the use of this medicine in children. Special care may be needed. Overdosage: If you think you've taken too much of this medicine contact a poison control center or emergency room at once. Overdosage: If you think you have taken too much of this medicine contact a poison control center or emergency room at once. NOTE: This medicine is only for you.  Do not share this medicine with others. What if I miss a dose? This does not apply. What may interact with this medicine? Do not take this medicine with any of the following medications: -amprenavir -bosentan -fosamprenavir This medicine may also interact with the following medications: -barbiturate medicines for inducing sleep or treating seizures -certain medicines for fungal infections like ketoconazole and itraconazole -griseofulvin -medicines to treat seizures like carbamazepine, felbamate, oxcarbazepine, phenytoin, topiramate -modafinil -phenylbutazone -rifampin -some medicines to treat HIV infection like atazanavir, indinavir, lopinavir, nelfinavir, tipranavir, ritonavir -St. John's wort This list may not describe all possible interactions. Give your health care provider a list of all the medicines, herbs, non-prescription drugs, or dietary supplements you use. Also tell them if you smoke, drink alcohol, or use illegal drugs. Some items may interact with your medicine. What should I watch for while using this medicine? This product does not protect you against HIV infection (AIDS) or other sexually transmitted diseases. You should be able to feel the implant by pressing your fingertips over the skin where it was inserted. Tell your doctor if you cannot feel the implant. What side effects may I notice from receiving this medicine? Side effects that you should report to your doctor or health care professional as soon as possible: -allergic reactions like skin rash, itching or hives, swelling of the face, lips, or tongue -breast lumps -changes in vision -confusion, trouble speaking or understanding -dark urine -depressed mood -general ill feeling or flu-like symptoms -light-colored stools -loss of appetite, nausea -right upper belly pain -severe headaches -severe pain, swelling, or tenderness in the abdomen -shortness of   breath, chest pain, swelling in a leg -signs of  pregnancy -sudden numbness or weakness of the face, arm or leg -trouble walking, dizziness, loss of balance or coordination -unusual vaginal bleeding, discharge -unusually weak or tired -yellowing of the eyes or skin Side effects that usually do not require medical attention (Report these to your doctor or health care professional if they continue or are bothersome.): -acne -breast pain -changes in weight -cough -fever or chills -headache -irregular menstrual bleeding -itching, burning, and vaginal discharge -pain or difficulty passing urine -sore throat This list may not describe all possible side effects. Call your doctor for medical advice about side effects. You may report side effects to FDA at 1-800-FDA-1088. Where should I keep my medicine? This drug is given in a hospital or clinic and will not be stored at home. NOTE: This sheet is a summary. It may not cover all possible information. If you have questions about this medicine, talk to your doctor, pharmacist, or health care provider.  2015, Elsevier/Gold Standard. (2012-03-21 15:37:45)  

## 2015-07-16 ENCOUNTER — Other Ambulatory Visit: Payer: Medicaid Other | Admitting: Women's Health

## 2015-10-21 ENCOUNTER — Emergency Department (HOSPITAL_COMMUNITY): Payer: Medicaid Other

## 2015-10-21 ENCOUNTER — Emergency Department (HOSPITAL_COMMUNITY)
Admission: EM | Admit: 2015-10-21 | Discharge: 2015-10-21 | Disposition: A | Discharge status: Home / Self Care | Payer: Medicaid Other | Attending: Emergency Medicine | Admitting: Emergency Medicine

## 2015-10-21 ENCOUNTER — Encounter (HOSPITAL_COMMUNITY): Payer: Self-pay | Admitting: Emergency Medicine

## 2015-10-21 DIAGNOSIS — M940 Chondrocostal junction syndrome [Tietze]: Secondary | ICD-10-CM | POA: Diagnosis not present

## 2015-10-21 DIAGNOSIS — R05 Cough: Secondary | ICD-10-CM | POA: Diagnosis not present

## 2015-10-21 DIAGNOSIS — Z880 Allergy status to penicillin: Secondary | ICD-10-CM | POA: Diagnosis not present

## 2015-10-21 DIAGNOSIS — Z79899 Other long term (current) drug therapy: Secondary | ICD-10-CM | POA: Diagnosis not present

## 2015-10-21 DIAGNOSIS — R059 Cough, unspecified: Secondary | ICD-10-CM

## 2015-10-21 MED ORDER — BENZONATATE 100 MG PO CAPS: 100.0000 mg | ORAL_CAPSULE | Freq: Three times a day (TID) | ORAL | Status: DC

## 2015-10-21 MED ORDER — HYDROCODONE-HOMATROPINE 5-1.5 MG/5ML PO SYRP
5.0000 mL | ORAL_SOLUTION | Freq: Once | ORAL | Status: AC
  Administered 2015-10-21: 5 mL via ORAL
  Filled 2015-10-21: qty 5

## 2015-10-21 MED ORDER — IBUPROFEN 600 MG PO TABS: 600.0000 mg | ORAL_TABLET | Freq: Four times a day (QID) | ORAL | Status: DC | PRN

## 2015-10-21 MED ORDER — IBUPROFEN 400 MG PO TABS
600.0000 mg | ORAL_TABLET | Freq: Once | ORAL | Status: AC
  Administered 2015-10-21: 600 mg via ORAL
  Filled 2015-10-21: qty 2

## 2015-10-21 NOTE — ED Provider Notes (Signed)
CSN: [id number]     Arrival date & time 10/21/15  2021 History   First MD Initiated Contact with Patient 10/21/15 2223     Chief Complaint  Patient presents with  . Cough    rib pain   (Consider location/radiation/quality/duration/timing/severity/associated sxs/prior Treatment) The history is provided by the patient. No language interpreter was used.    Christina Ingram is a 20 y.o female with no past medical history who presents for left-sided rib pain due to 3 weeks of yellow and white sputum cough. She reports that it is worse with moving her left arm. She denies any treatment prior to arrival. She denies any smoking history. Denies being pregnant or on birth control.  She denies any fever, chills, shortness of breath, chest pain.  Past Medical History  Diagnosis Date  . Medical history non-contributory    Past Surgical History  Procedure Laterality Date  . Wisdom tooth extraction     Family History  Problem Relation Age of Onset  . Cancer Mother   . Clotting disorder Mother   . Venous thrombosis Mother   . Hypertension Mother   . Diabetes Maternal Grandmother   . Hypertension Maternal Grandmother   . Diabetes Maternal Grandfather    Social History  Substance Use Topics  . Smoking status: Never Smoker   . Smokeless tobacco: Never Used  . Alcohol Use: No   OB History    Gravida Para Term Preterm AB TAB SAB Ectopic Multiple Living   0 1     Review of Systems  Constitutional: Negative for fever and chills.  Respiratory: Positive for cough. Negative for shortness of breath and wheezing.   Cardiovascular: Negative for chest pain.  Musculoskeletal: Positive for myalgias.  All other systems reviewed and are negative.     Allergies  Penicillins  Home Medications   Prior to Admission medications   Medication Sig Start Date End Date Taking? Authorizing Provider  escitalopram (LEXAPRO) 10 MG tablet Take 1 tablet (10 mg total) by mouth daily. 06/18/15  Yes  Cheral Marker, CNM  benzonatate (TESSALON) 100 MG capsule Take 1 capsule (100 mg total) by mouth every 8 (eight) hours. 10/21/15   Margrett Kalb Patel-Mills, PA-C  ibuprofen (ADVIL,MOTRIN) 600 MG tablet Take 1 tablet (600 mg total) by mouth every 6 (six) hours as needed. 10/21/15   Aubriegh Minch Patel-Mills, PA-C   BP 130/91 mmHg  Pulse 87  Temp(Src) 98.6 F (37 C) (Oral)  Resp 20  Ht 5' 1.25" (1.556 m)  Wt 83.915 kg  BMI 34.66 kg/m2  SpO2 100%  LMP 10/14/2015 Physical Exam  Constitutional: She is oriented to person, place, and time. She appears well-developed and well-nourished.  HENT:  Head: Normocephalic and atraumatic.  Eyes: Conjunctivae are normal.  Neck: Normal range of motion. Neck supple.  Cardiovascular: Normal rate, regular rhythm and normal heart sounds.   Pulmonary/Chest: Effort normal and breath sounds normal. No accessory muscle usage. No respiratory distress. She has no decreased breath sounds. She has no wheezes.  No decreased breath sounds, wheezing, or crackles. No respiratory distress or use of accessory muscles.  Reproducible left sided chest wall tenderness along the middle ribs. No rash. No deformity or flail chest.   Abdominal: Soft. There is no tenderness.  Musculoskeletal: Normal range of motion.  Neurological: She is alert and oriented to person, place, and time.  Skin: Skin is warm and dry.  Nursing note and vitals reviewed.   ED Course  Procedures (including critical care time) Labs Review Labs Reviewed - No data to display  Imaging Review Dg Chest 2 View  10/21/2015  CLINICAL DATA:  Pt c/o dry "hacking" cough x 1 month. Intermittent SOB. Upper back pain. Hx nonsmoker EXAM: CHEST  2 VIEW COMPARISON:  None. FINDINGS: The heart size and mediastinal contours are within normal limits. Both lungs are clear. The visualized skeletal structures are unremarkable. IMPRESSION: No active cardiopulmonary disease. Electronically Signed   By: Norva Pavlov M.D.   On:  10/21/2015 20:43   I have personally reviewed and evaluated these image results as part of my medical decision-making.   EKG Interpretation None      MDM   Final diagnoses:  Costochondritis, acute  Cough   Patient presents for left rib tenderness and coughing x 3 weeks.  His is most likely costochondritis. She had no decreased breath sounds are concerning signs on exam. No respiratory distress. 100% oxygen on room air. Afebrile. Chest x-ray is negative for infiltrate, edema, or pneumothorax. Patient was prescribed Tessalon Perles and ibuprofen. I discussed return precautions as well as follow-up. Patient agrees with plan. Medications  HYDROcodone-homatropine (HYCODAN) 5-1.5 MG/5ML syrup 5 mL (5 mLs Oral Given 10/21/15 2242)  ibuprofen (ADVIL,MOTRIN) tablet 600 mg (600 mg Oral Given 10/21/15 2242)   Filed Vitals:   10/21/15 2244 10/21/15 2245  BP:  130/91  Pulse:  87  Temp: 98.6 F (37 C) 98.6 F (37 C)  Resp:  [id number], PA-C 10/22/15 0112  Raeford Razor, MD 10/23/15 1341

## 2015-10-21 NOTE — Discharge Instructions (Signed)
Costochondritis Follow-up with her primary care physician in 2 days. Take medications as prescribed. Take ibuprofen or Tylenol for pain. Costochondritis is a condition in which the tissue (cartilage) that connects your ribs with your breastbone (sternum) becomes irritated. It causes pain in the chest and rib area. It usually goes away on its own over time. HOME CARE  Avoid activities that wear you out.  Do not strain your ribs. Avoid activities that use your:  Chest.  Belly.  Side muscles.  Put ice on the area for the first 2 days after the pain starts.  Put ice in a plastic bag.  Place a towel between your skin and the bag.  Leave the ice on for 20 minutes, 2-3 times a day.  Only take medicine as told by your doctor. GET HELP IF:  You have redness or puffiness (swelling) in the rib area.  Your pain does not go away with rest or medicine. GET HELP RIGHT AWAY IF:   Your pain gets worse.  You are very uncomfortable.  You have trouble breathing.  You cough up blood.  You start sweating or throwing up (vomiting).  You have a fever or lasting symptoms for more than 2-3 days.  You have a fever and your symptoms suddenly get worse. MAKE SURE YOU:   Understand these instructions.  Will watch your condition.  Will get help right away if you are not doing well or get worse.   This information is not intended to replace advice given to you by your health care provider. Make sure you discuss any questions you have with your health care provider.   Document Released: 03/02/2008 Document Revised: 05/17/2013 Document Reviewed: 04/18/2013 Elsevier Interactive Patient Education 2016 Elsevier Inc.  Cough, Adult A cough helps to clear your throat and lungs. A cough may last only 2-3 weeks (acute), or it may last longer than 8 weeks (chronic). Many different things can cause a cough. A cough may be a sign of an illness or another medical condition. HOME CARE  Pay attention  to any changes in your cough.  Take medicines only as told by your doctor.  If you were prescribed an antibiotic medicine, take it as told by your doctor. Do not stop taking it even if you start to feel better.  Talk with your doctor before you try using a cough medicine.  Drink enough fluid to keep your pee (urine) clear or pale yellow.  If the air is dry, use a cold steam vaporizer or humidifier in your home.  Stay away from things that make you cough at work or at home.  If your cough is worse at night, try using extra pillows to raise your head up higher while you sleep.  Do not smoke, and try not to be around smoke. If you need help quitting, ask your doctor.  Do not have caffeine.  Do not drink alcohol.  Rest as needed. GET HELP IF:  You have new problems (symptoms).  You cough up yellow fluid (pus).  Your cough does not get better after 2-3 weeks, or your cough gets worse.  Medicine does not help your cough and you are not sleeping well.  You have pain that gets worse or pain that is not helped with medicine.  You have a fever.  You are losing weight and you do not know why.  You have night sweats. GET HELP RIGHT AWAY IF:  You cough up blood.  You have trouble breathing.  Your  heartbeat is very fast.   This information is not intended to replace advice given to you by your health care provider. Make sure you discuss any questions you have with your health care provider.   Document Released: 05/28/2011 Document Revised: 06/05/2015 Document Reviewed: 11/21/2014 Elsevier Interactive Patient Education [employer].

## 2015-10-21 NOTE — ED Notes (Signed)
Coughing x 1 month, Complaining of rib pain on left side

## 2016-01-20 ENCOUNTER — Encounter (HOSPITAL_COMMUNITY): Payer: Self-pay

## 2016-01-20 ENCOUNTER — Emergency Department (HOSPITAL_COMMUNITY)
Admission: EM | Admit: 2016-01-20 | Discharge: 2016-01-20 | Disposition: A | Discharge status: Home / Self Care | Payer: Medicaid Other | Attending: Emergency Medicine | Admitting: Emergency Medicine

## 2016-01-20 DIAGNOSIS — R51 Headache: Secondary | ICD-10-CM | POA: Diagnosis present

## 2016-01-20 DIAGNOSIS — R519 Headache, unspecified: Secondary | ICD-10-CM

## 2016-01-20 MED ORDER — PROMETHAZINE HCL 12.5 MG PO TABS: 12.5000 mg | ORAL_TABLET | Freq: Four times a day (QID) | ORAL | Status: DC | PRN

## 2016-01-20 MED ORDER — BUTALBITAL-APAP-CAFF-COD 50-325-40-30 MG PO CAPS: 1.0000 | ORAL_CAPSULE | ORAL | Status: DC | PRN

## 2016-01-20 NOTE — Discharge Instructions (Signed)
Please use butalbital codeine for pain. Use promethazine for nausea if needed. Please see the headache specialist listed above for additional evaluation concerning her headaches.

## 2016-01-20 NOTE — ED Provider Notes (Signed)
CSN: [id number]     Arrival date & time 01/20/16  1702 History  By signing my name below, I, Christina Ingram, attest that this documentation has been prepared under the direction and in the presence of Christina QualeHobson Shalene Gallen, PA-C.  Electronically Signed: Tanda RockersMargaux Ingram, ED Scribe. 01/20/2016. 7:38 PM.   Chief Complaint  Patient presents with  . Headache   The history is provided by the patient. No language interpreter was used.     HPI Comments: Christina Ingram is a 20 y.o. female who presents to the Emergency Department complaining of gradual onset, constant, frontal headache x 1 day. No known injury to the head. Pt does not have a hx of headaches and has never had one this severe. She has not taken anything for the headache. Denies nausea, vomiting, photophobia, or any other associated symptoms.    Past Medical History  Diagnosis Date  . Medical history non-contributory    Past Surgical History  Procedure Laterality Date  . Wisdom tooth extraction     Family History  Problem Relation Age of Onset  . Cancer Mother   . Clotting disorder Mother   . Venous thrombosis Mother   . Hypertension Mother   . Diabetes Maternal Grandmother   . Hypertension Maternal Grandmother   . Diabetes Maternal Grandfather    Social History  Substance Use Topics  . Smoking status: Never Smoker   . Smokeless tobacco: Never Used  . Alcohol Use: No   OB History    Gravida Para Term Preterm AB TAB SAB Ectopic Multiple Living   1 1 1       0 1     Review of Systems  Eyes: Negative for visual disturbance.  Gastrointestinal: Negative for nausea and vomiting.  Neurological: Positive for headaches.  All other systems reviewed and are negative.  Allergies  Penicillins  Home Medications   Prior to Admission medications   Medication Sig Start Date End Date Taking? Authorizing Provider  benzonatate (TESSALON) 100 MG capsule Take 1 capsule (100 mg total) by mouth every 8 (eight) hours. 10/21/15   Hanna  Patel-Mills, PA-C  escitalopram (LEXAPRO) 10 MG tablet Take 1 tablet (10 mg total) by mouth daily. 06/18/15   Cheral MarkerKimberly R Booker, CNM  ibuprofen (ADVIL,MOTRIN) 600 MG tablet Take 1 tablet (600 mg total) by mouth every 6 (six) hours as needed. 10/21/15   Hanna Patel-Mills, PA-C   BP 138/77 mmHg  Pulse 88  Temp(Src) 99.3 F (37.4 C) (Tympanic)  Resp 18  Ht 5\' 1"  (1.549 m)  Wt 199 lb 8 oz (90.493 kg)  BMI 37.71 kg/m2  SpO2 100%  LMP 01/19/2016   Physical Exam  Constitutional: She is oriented to person, place, and time. She appears well-developed and well-nourished. No distress.  HENT:  Head: Normocephalic and atraumatic.  Mouth/Throat: Oropharynx is clear and moist.  Eyes: Conjunctivae and EOM are normal. Pupils are equal, round, and reactive to light.  Neck: Neck supple. No tracheal deviation present.  Cardiovascular: Normal rate, regular rhythm and normal heart sounds.   Pulmonary/Chest: Effort normal and breath sounds normal. No respiratory distress. She has no wheezes. She has no rales.  Musculoskeletal: Normal range of motion.  Neurological: She is alert and oriented to person, place, and time.  Grip is symmetrical. There is no ulnar drift.  Cranial nerves are intact.  No gross neurological deficit.   Skin: Skin is warm and dry.  Psychiatric: She has a normal mood and affect. Her behavior is normal.  Nursing  note and vitals reviewed.   ED Course  Procedures (including critical care time)  DIAGNOSTIC STUDIES: Oxygen Saturation is 100% on RA, normal by my interpretation.    COORDINATION OF CARE: 7:37 PM-Discussed treatment plan with pt at bedside and pt agreed to plan.   Labs Review Labs Reviewed - No data to display  Imaging Review No results found.   EKG Interpretation None      MDM  No gross neuro deficit. No hx of trauma. Pt in no distress.  Rx for promethazine and butalbital caffeine given to the patient. [facility].    Final diagnoses:  Headache, unspecified headache type    **I personally performed the services described in this documentation, which was scribed in my presence. The recorded information has been reviewed and is accurate.*  I have reviewed nursing notes, vital signs, and all appropriate lab and imaging results for this patient.     Christina Quale, PA-C 01/21/16 2349  Benjiman Core, MD 01/27/16 [phone]

## 2016-01-20 NOTE — ED Notes (Signed)
Headache X2 days. Denies N/V or photophobia. Patient states she did not take any tylenol or motrin for headache.

## 2016-03-06 ENCOUNTER — Emergency Department (HOSPITAL_COMMUNITY)
Admission: EM | Admit: 2016-03-06 | Discharge: 2016-03-06 | Disposition: A | Discharge status: Home / Self Care | Payer: Medicaid Other | Attending: Emergency Medicine | Admitting: Emergency Medicine

## 2016-03-06 ENCOUNTER — Encounter (HOSPITAL_COMMUNITY): Payer: Self-pay | Admitting: Cardiology

## 2016-03-06 DIAGNOSIS — L02213 Cutaneous abscess of chest wall: Secondary | ICD-10-CM | POA: Diagnosis not present

## 2016-03-06 DIAGNOSIS — J869 Pyothorax without fistula: Secondary | ICD-10-CM

## 2016-03-06 DIAGNOSIS — R072 Precordial pain: Secondary | ICD-10-CM | POA: Diagnosis present

## 2016-03-06 DIAGNOSIS — Z791 Long term (current) use of non-steroidal anti-inflammatories (NSAID): Secondary | ICD-10-CM | POA: Insufficient documentation

## 2016-03-06 DIAGNOSIS — M545 Low back pain, unspecified: Secondary | ICD-10-CM

## 2016-03-06 MED ORDER — SULFAMETHOXAZOLE-TRIMETHOPRIM 800-160 MG PO TABS: 1.0000 | ORAL_TABLET | Freq: Two times a day (BID) | ORAL | Status: AC

## 2016-03-06 MED ORDER — METHOCARBAMOL 500 MG PO TABS: 500.0000 mg | ORAL_TABLET | Freq: Two times a day (BID) | ORAL | Status: DC

## 2016-03-06 NOTE — Discharge Instructions (Signed)
Back Pain, Adult °Back pain is very common in adults. The cause of back pain is rarely dangerous and the pain often gets better over time. The cause of your back pain may not be known. Some common causes of back pain include: °· Strain of the muscles or ligaments supporting the spine. °· Wear and tear (degeneration) of the spinal disks. °· Arthritis. °· Direct injury to the back. °For many people, back pain may return. Since back pain is rarely dangerous, most people can learn to manage this condition on their own. °HOME CARE INSTRUCTIONS °Watch your back pain for any changes. The following actions may help to lessen any discomfort you are feeling: °· Remain active. It is stressful on your back to sit or stand in one place for long periods of time. Do not sit, drive, or stand in one place for more than 30 minutes at a time. Take short walks on even surfaces as soon as you are able. Try to increase the length of time you walk each day. °· Exercise regularly as directed by your health care provider. Exercise helps your back heal faster. It also helps avoid future injury by keeping your muscles strong and flexible. °· Do not stay in bed. Resting more than 1-2 days can delay your recovery. °· Pay attention to your body when you bend and lift. The most comfortable positions are those that put less stress on your recovering back. Always use proper lifting techniques, including: °· Bending your knees. °· Keeping the load close to your body. °· Avoiding twisting. °· Find a comfortable position to sleep. Use a firm mattress and lie on your side with your knees slightly bent. If you lie on your back, put a pillow under your knees. °· Avoid feeling anxious or stressed. Stress increases muscle tension and can worsen back pain. It is important to recognize when you are anxious or stressed and learn ways to manage it, such as with exercise. °· Take medicines only as directed by your health care provider. Over-the-counter  medicines to reduce pain and inflammation are often the most helpful. Your health care provider may prescribe muscle relaxant drugs. These medicines help dull your pain so you can more quickly return to your normal activities and healthy exercise. °· Apply ice to the injured area: °· Put ice in a plastic bag. °· Place a towel between your skin and the bag. °· Leave the ice on for 20 minutes, 2-3 times a day for the first 2-3 days. After that, ice and heat may be alternated to reduce pain and spasms. °· Maintain a healthy weight. Excess weight puts extra stress on your back and makes it difficult to maintain good posture. °SEEK MEDICAL CARE IF: °· You have pain that is not relieved with rest or medicine. °· You have increasing pain going down into the legs or buttocks. °· You have pain that does not improve in one week. °· You have night pain. °· You lose weight. °· You have a fever or chills. °SEEK IMMEDIATE MEDICAL CARE IF:  °· You develop new bowel or bladder control problems. °· You have unusual weakness or numbness in your arms or legs. °· You develop nausea or vomiting. °· You develop abdominal pain. °· You feel faint. °  °This information is not intended to replace advice given to you by your health care provider. Make sure you discuss any questions you have with your health care provider. °  °Document Released: 09/14/2005 Document Revised: 10/05/2014 Document Reviewed: 01/16/2014 °Elsevier Interactive Patient Education ©2016 Else[employer]   Inc[employer] Abscess An abscess is an infected area that contains a collection of pus and debris.It can occur in almost any part of the body. An abscess is also known as a furuncle or boil. CAUSES  An abscess occurs when tissue gets infected. This can occur from blockage of oil or sweat glands, infection of hair follicles, or a minor injury to the skin. As the body tries to fight the infection, pus collects in the area and creates pressure under the skin. This pressure causes pain.  People with weakened immune systems have difficulty fighting infections and get certain abscesses more often.  SYMPTOMS Usually an abscess develops on the skin and becomes a painful mass that is red, warm, and tender. If the abscess forms under the skin, you may feel a moveable soft area under the skin. Some abscesses break open (rupture) on their own, but most will continue to get worse without care. The infection can spread deeper into the body and eventually into the bloodstream, causing you to feel ill.  DIAGNOSIS  Your caregiver will take your medical history and perform a physical exam. A sample of fluid may also be taken from the abscess to determine what is causing your infection. TREATMENT  Your caregiver may prescribe antibiotic medicines to fight the infection. However, taking antibiotics alone usually does not cure an abscess. Your caregiver may need to make a small cut (incision) in the abscess to drain the pus. In some cases, gauze is packed into the abscess to reduce pain and to continue draining the area. HOME CARE INSTRUCTIONS   Only take over-the-counter or prescription medicines for pain, discomfort, or fever as directed by your caregiver.  If you were prescribed antibiotics, take them as directed. Finish them even if you start to feel better.  If gauze is used, follow your caregiver's directions for changing the gauze.  To avoid spreading the infection:  Keep your draining abscess covered with a bandage.  Wash your hands well.  Do not share personal care items, towels, or whirlpools with others.  Avoid skin contact with others.  Keep your skin and clothes clean around the abscess.  Keep all follow-up appointments as directed by your caregiver. SEEK MEDICAL CARE IF:   You have increased pain, swelling, redness, fluid drainage, or bleeding.  You have muscle aches, chills, or a general ill feeling.  You have a fever. MAKE SURE YOU:   Understand these  instructions.  Will watch your condition.  Will get help right away if you are not doing well or get worse.   This information is not intended to replace advice given to you by your health care provider. Make sure you discuss any questions you have with your health care provider.   Document Released: 06/24/2005 Document Revised: 03/15/2012 Document Reviewed: 11/27/2011 Elsevier Interactive Patient Education [employer]2016 Else[employer] Inc[employer]

## 2016-03-06 NOTE — ED Provider Notes (Signed)
CSN: [id number]     Arrival date & time 03/06/16  1302 History   First MD Initiated Contact with Patient 03/06/16 1411     No chief complaint on file.    (Consider location/radiation/quality/duration/timing/severity/associated sxs/prior Treatment) Patient is a 20 y.o. female presenting with chest pain. The history is provided by the patient. No language interpreter was used.  Chest Pain Pain location:  Substernal area Pain quality: aching   Pain radiates to:  Does not radiate Pain radiates to the back: no   Pain severity:  Mild Onset quality:  Gradual Duration:  1 day Timing:  Constant Progression:  Worsening Chronicity:  New Context: no stress   Relieved by:  Nothing Worsened by:  Nothing tried Ineffective treatments:  None tried Associated symptoms: no abdominal pain   Risk factors: birth control     Past Medical History  Diagnosis Date  . Medical history non-contributory    Past Surgical History  Procedure Laterality Date  . Wisdom tooth extraction     Family History  Problem Relation Age of Onset  . Cancer Mother   . Clotting disorder Mother   . Venous thrombosis Mother   . Hypertension Mother   . Diabetes Maternal Grandmother   . Hypertension Maternal Grandmother   . Diabetes Maternal Grandfather    Social History  Substance Use Topics  . Smoking status: Never Smoker   . Smokeless tobacco: Never Used  . Alcohol Use: No   OB History    Gravida Para Term Preterm AB TAB SAB Ectopic Multiple Living   1 1 1       0 1     Review of Systems  Cardiovascular: Positive for chest pain.  Gastrointestinal: Negative for abdominal pain.  All other systems reviewed and are negative.     Allergies  Penicillins  Home Medications   Prior to Admission medications   Medication Sig Start Date End Date Taking? Authorizing Provider  benzonatate (TESSALON) 100 MG capsule Take 1 capsule (100 mg total) by mouth every 8 (eight) hours. 10/21/15   Hanna Patel-Mills, PA-C   butalbital-acetaminophen-caffeine (FIORICET/CODEINE) 50-325-40-30 MG capsule Take 1 capsule by mouth every 4 (four) hours as needed for headache. 01/20/16   Ivery QualeHobson Bryant, PA-C  escitalopram (LEXAPRO) 10 MG tablet Take 1 tablet (10 mg total) by mouth daily. 06/18/15   Cheral MarkerKimberly R Booker, CNM  ibuprofen (ADVIL,MOTRIN) 600 MG tablet Take 1 tablet (600 mg total) by mouth every 6 (six) hours as needed. 10/21/15   Hanna Patel-Mills, PA-C  methocarbamol (ROBAXIN) 500 MG tablet Take 1 tablet (500 mg total) by mouth 2 (two) times daily. 03/06/16   Elson AreasLeslie K Waylan Busta, PA-C  promethazine (PHENERGAN) 12.5 MG tablet Take 1 tablet (12.5 mg total) by mouth every 6 (six) hours as needed for nausea or vomiting. 01/20/16   Ivery QualeHobson Bryant, PA-C  sulfamethoxazole-trimethoprim (BACTRIM DS,SEPTRA DS) 800-160 MG tablet Take 1 tablet by mouth 2 (two) times daily. 03/06/16 03/13/16  Elson AreasLeslie K Mekesha Solomon, PA-C   LMP 02/16/2016 Physical Exam  Constitutional: She is oriented to person, place, and time. She appears well-developed and well-nourished.  HENT:  Head: Normocephalic.  Eyes: Conjunctivae and EOM are normal. Pupils are equal, round, and reactive to light.  Neck: Normal range of motion.  Cardiovascular: Normal rate.   Pulmonary/Chest: Effort normal.  Abdominal: She exhibits no distension.  Musculoskeletal: Normal range of motion.  Neurological: She is alert and oriented to person, place, and time.  Skin: Skin is warm.  Psychiatric: She has a normal  mood and affect.  Nursing note and vitals reviewed.   ED Course  Procedures (including critical care time) Labs Review Labs Reviewed - No data to display  Imaging Review No results found. I have personally reviewed and evaluated these images and lab results as part of my medical decision-making.   EKG Interpretation None      MDM tender mid back  To palpationno deformity   Final diagnoses:  Abscess of chest (HCC)  Midline low back pain without sciatica    Meds  ordered this encounter  Medications  . sulfamethoxazole-trimethoprim (BACTRIM DS,SEPTRA DS) 800-160 MG tablet    Sig: Take 1 tablet by mouth 2 (two) times daily.    Dispense:  14 tablet    Refill:  0    Order Specific Question:  Supervising Provider    Answer:  MILLER, BRIAN [[id number]]  . methocarbamol (ROBAXIN) 500 MG tablet    Sig: Take 1 tablet (500 mg total) by mouth 2 (two) times daily.    Dispense:  20 tablet    Refill:  0    Order Specific Question:  Supervising Provider    Answer:  Eber Hong [[id number]]  An After Visit Summary was printed and given to the patient.    Lonia Skinner Bowen, PA-C 03/06/16 1424  Marily Memos, MD 03/06/16 [phone]

## 2016-03-06 NOTE — ED Notes (Signed)
Middle back pain times one month.  Denies any injury.  Also a cyst in between breast, that she just noticed.  Also c/o right breast pain.

## 2016-03-08 ENCOUNTER — Other Ambulatory Visit: Payer: Self-pay | Admitting: Obstetrics and Gynecology

## 2016-06-16 ENCOUNTER — Emergency Department (HOSPITAL_COMMUNITY)
Admission: EM | Admit: 2016-06-16 | Discharge: 2016-06-16 | Disposition: A | Discharge status: Home / Self Care | Payer: Medicaid Other | Attending: Dermatology | Admitting: Dermatology

## 2016-06-16 ENCOUNTER — Encounter (HOSPITAL_COMMUNITY): Payer: Self-pay | Admitting: Emergency Medicine

## 2016-06-16 DIAGNOSIS — R042 Hemoptysis: Secondary | ICD-10-CM | POA: Insufficient documentation

## 2016-06-16 DIAGNOSIS — Z5321 Procedure and treatment not carried out due to patient leaving prior to being seen by health care provider: Secondary | ICD-10-CM | POA: Insufficient documentation

## 2016-06-16 DIAGNOSIS — J029 Acute pharyngitis, unspecified: Secondary | ICD-10-CM | POA: Diagnosis not present

## 2016-06-16 DIAGNOSIS — R51 Headache: Secondary | ICD-10-CM | POA: Diagnosis present

## 2016-06-16 NOTE — ED Triage Notes (Signed)
Headache started last night and sore throat.  Pt states that she has been "coughing up blood" Pt states that had "trouble breathing last night and this morning, but not anymore"

## 2016-07-12 ENCOUNTER — Emergency Department (HOSPITAL_COMMUNITY)
Admission: EM | Admit: 2016-07-12 | Discharge: 2016-07-12 | Disposition: A | Discharge status: Home / Self Care | Payer: Medicaid Other | Attending: Emergency Medicine | Admitting: Emergency Medicine

## 2016-07-12 ENCOUNTER — Encounter (HOSPITAL_COMMUNITY): Payer: Self-pay | Admitting: Emergency Medicine

## 2016-07-12 DIAGNOSIS — M25512 Pain in left shoulder: Secondary | ICD-10-CM | POA: Diagnosis present

## 2016-07-12 DIAGNOSIS — Y999 Unspecified external cause status: Secondary | ICD-10-CM | POA: Diagnosis not present

## 2016-07-12 DIAGNOSIS — Y929 Unspecified place or not applicable: Secondary | ICD-10-CM | POA: Insufficient documentation

## 2016-07-12 DIAGNOSIS — Y939 Activity, unspecified: Secondary | ICD-10-CM | POA: Insufficient documentation

## 2016-07-12 DIAGNOSIS — R079 Chest pain, unspecified: Secondary | ICD-10-CM | POA: Insufficient documentation

## 2016-07-12 DIAGNOSIS — X500XXA Overexertion from strenuous movement or load, initial encounter: Secondary | ICD-10-CM | POA: Diagnosis not present

## 2016-07-12 DIAGNOSIS — S46812A Strain of other muscles, fascia and tendons at shoulder and upper arm level, left arm, initial encounter: Secondary | ICD-10-CM | POA: Diagnosis not present

## 2016-07-12 MED ORDER — IBUPROFEN 800 MG PO TABS: 800.0000 mg | ORAL_TABLET | Freq: Three times a day (TID) | ORAL | 0 refills | Status: DC

## 2016-07-12 MED ORDER — TRAMADOL HCL 50 MG PO TABS: 50.0000 mg | ORAL_TABLET | Freq: Four times a day (QID) | ORAL | 0 refills | Status: DC | PRN

## 2016-07-12 MED ORDER — DIAZEPAM 5 MG PO TABS
10.0000 mg | ORAL_TABLET | Freq: Once | ORAL | Status: AC
  Administered 2016-07-12: 10 mg via ORAL
  Filled 2016-07-12: qty 2

## 2016-07-12 MED ORDER — TRAMADOL HCL 50 MG PO TABS
50.0000 mg | ORAL_TABLET | Freq: Once | ORAL | Status: AC
  Administered 2016-07-12: 50 mg via ORAL
  Filled 2016-07-12: qty 1

## 2016-07-12 MED ORDER — KETOROLAC TROMETHAMINE 10 MG PO TABS
10.0000 mg | ORAL_TABLET | Freq: Once | ORAL | Status: AC
  Administered 2016-07-12: 10 mg via ORAL
  Filled 2016-07-12: qty 1

## 2016-07-12 MED ORDER — METHOCARBAMOL 500 MG PO TABS: 500.0000 mg | ORAL_TABLET | Freq: Three times a day (TID) | ORAL | 0 refills | Status: DC

## 2016-07-12 NOTE — Discharge Instructions (Signed)
Your examination favors a trapezius strain. Please use the sling for the next 5-7 days. Use ibuprofen 800 mg 3 times daily, use Robaxin 3 times daily for spasm pain. May use Ultram for more severe pain.This medication may cause drowsiness. Please do not drink, drive, or participate in activity that requires concentration while taking this medication.

## 2016-07-12 NOTE — ED Triage Notes (Addendum)
Pt with c/o L. Neck pain, L. Shoulder pain, and back pain between shoulder blades. Pt also c/o bilateral tingling to both arms and fingers. Pt also c/o intermittent chest pain.

## 2016-07-12 NOTE — ED Provider Notes (Signed)
[facility]Y DEPT Provider Note   CSN: [id number] Arrival date & time: 07/12/16  4098  By signing my name below, I, Linna Darner, attest that this documentation has been prepared under the direction and in the presence of Ivery Quale, PA-C. Electronically Signed: Linna Darner, Scribe. 07/12/2016. 8:49 PM.  History   Chief Complaint Chief Complaint  Patient presents with  . Neck Pain  . Shoulder Pain  . Back Pain    The history is provided by the patient. No language interpreter was used.  Neck Pain   This is a new problem. The current episode started more than 1 week ago. The problem occurs constantly. The problem has been gradually worsening. There has been no fever. The pain is present in the occipital region. The pain radiates to the left shoulder. The pain is severe. Worse during: worse in the morning. Associated symptoms include chest pain (intermittent). Pertinent negatives include no numbness, no tingling and no weakness.  Shoulder Pain   This is a new problem. The current episode started more than 1 week ago. The problem occurs constantly. The problem has been gradually worsening. The pain is present in the left shoulder. The quality of the pain is described as constant. The pain is severe. Pertinent negatives include no numbness and no tingling.  Back Pain   This is a new problem. The current episode started more than 1 week ago. The problem occurs constantly. The problem has been gradually worsening. The pain is present in the thoracic spine. The pain is severe. Associated symptoms include chest pain (intermittent). Pertinent negatives include no numbness, no tingling and no weakness.     HPI Comments: Christina Ingram is a 20 y.o. female who presents to the Emergency Department complaining of constant, worsening, posterior neck pain for the last month. She notes pain radiation to her left shoulder, left trapezius, and between her shoulder blades. She also notes  intermittent pain radiation into her chest. She states her pain is worse in the morning. No alleviating factors noted. Pt reports this is the first time she has been evaluated since onset and notes she does not have a PCP. She reports she does frequent heavy lifting and believes this could be the cause of her pain. She denies possibility of pregnancy. Pt denies numbness, weakness, or any other associated symptoms.  Past Medical History:  Diagnosis Date  . Medical history non-contributory     Patient Active Problem List   Diagnosis Date Noted  . Nexplanon insertion 06/18/2015  . 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 IUGR (intrauterine growth restriction) 04/21/2015    Past Surgical History:  Procedure Laterality Date  . WISDOM TOOTH EXTRACTION      OB History    Gravida Para Term Preterm AB Living   1 1 1     1    SAB TAB Ectopic Multiple Live Births         0 1       Home Medications    Prior to Admission medications   Medication Sig Start Date End Date Taking? Authorizing Provider  benzonatate (TESSALON) 100 MG capsule Take 1 capsule (100 mg total) by mouth every 8 (eight) hours. 10/21/15   Hanna Patel-Mills, PA-C  butalbital-acetaminophen-caffeine (FIORICET/CODEINE) 50-325-40-30 MG capsule Take 1 capsule by mouth every 4 (four) hours as needed for headache. 01/20/16   Ivery Quale, PA-C  escitalopram (LEXAPRO) 10 MG tablet Take 1 tablet (10 mg total) by mouth daily. 06/18/15   Cheral Marker, CNM  ibuprofen (  ADVIL,MOTRIN) 600 MG tablet Take 1 tablet (600 mg total) by mouth every 6 (six) hours as needed. 10/21/15   Hanna Patel-Mills, PA-C  methocarbamol (ROBAXIN) 500 MG tablet Take 1 tablet (500 mg total) by mouth 2 (two) times daily. 03/06/16   Elson AreasLeslie K Sofia, PA-C  prenatal vitamin w/FE, FA (PRENATAL 1 + 1) 27-1 MG TABS tablet TAKE ONE TABLET BY MOUTH ONCE DAILY AT  NOON 03/09/16   Tilda BurrowJohn V Ferguson, MD  promethazine (PHENERGAN) 12.5 MG tablet Take 1 tablet (12.5 mg total) by mouth  every 6 (six) hours as needed for nausea or vomiting. 01/20/16   Ivery QualeHobson Lonzy Mato, PA-C    Family History Family History  Problem Relation Age of Onset  . Cancer Mother   . Clotting disorder Mother   . Venous thrombosis Mother   . Hypertension Mother   . Diabetes Maternal Grandmother   . Hypertension Maternal Grandmother   . Diabetes Maternal Grandfather     Social History Social History  Substance Use Topics  . Smoking status: Never Smoker  . Smokeless tobacco: Never Used  . Alcohol use No     Allergies   Penicillins   Review of Systems Review of Systems  Cardiovascular: Positive for chest pain (intermittent).  Musculoskeletal: Positive for back pain (between shoulder blades), myalgias (left shoulder, left trapezius) and neck pain (psoterior).  Neurological: Negative for tingling, weakness and numbness.  All other systems reviewed and are negative.   Physical Exam Updated Vital Signs BP 128/76 (BP Location: Left Arm)   Pulse 78   Temp 98 F (36.7 C) (Oral)   Resp 18   Ht 5\' 1"  (1.549 m)   Wt 185 lb (83.9 kg)   LMP 07/11/2016   SpO2 100%   BMI 34.96 kg/m   Physical Exam  Constitutional: She is oriented to person, place, and time. She appears well-developed and well-nourished. No distress.  HENT:  Head: Normocephalic and atraumatic.  Eyes: Conjunctivae and EOM are normal.  Neck: Neck supple. No tracheal deviation present.  No carotid bruit  Cardiovascular: Normal rate.   Pulmonary/Chest: Effort normal. No respiratory distress.  Lungs are CTA.  Musculoskeletal: Normal range of motion.  Pain of the left trapezius  Neurological: She is alert and oriented to person, place, and time.  No motor deficit of upper extremities.  Skin: Skin is warm and dry.  Psychiatric: She has a normal mood and affect. Her behavior is normal.  Nursing note and vitals reviewed.   ED Treatments / Results  Labs (all labs ordered are listed, but only abnormal results are  displayed) Labs Reviewed - No data to display  EKG  EKG Interpretation None       Radiology No results found.  Procedures Procedures (including critical care time)  DIAGNOSTIC STUDIES: Oxygen Saturation is 100% on RA, normal by my interpretation.    COORDINATION OF CARE: 8:56 PM Discussed treatment plan with pt at bedside and pt agreed to plan.  9:05 PM Will discharge with shoulder sling, steroid medication, and anti-inflammatory medication. Will give referral to orthopedics.  Medications Ordered in ED Medications - No data to display   Initial Impression / Assessment and Plan / ED Course  I have reviewed the triage vital signs and the nursing notes.  Pertinent labs & imaging results that were available during my care of the patient were reviewed by me and considered in my medical decision making (see chart for details).  Clinical Course    *I have reviewed nursing notes,  vital signs, and all appropriate lab and imaging results for this patient.** **I personally performed the services described in this documentation, which was scribed in my presence. The recorded information has been reviewed and is accurate.*  Final Clinical Impressions(s) / ED Diagnoses  Vital signs within normal limits. The examination favors a trapezius strain. No gross neurologic deficit appreciated at this time. Patient is treated with a sling. Muscle relaxant, pain medication, and anti-inflammatory medication. Patient advised to follow with Dr. Romeo Apple for orthopedic evaluation if not improving.    Final diagnoses:  None    New Prescriptions New Prescriptions   No medications on file     Ivery Quale, PA-C 07/12/16 2154    Samuel Jester, DO 07/14/16 2023

## 2016-10-10 ENCOUNTER — Encounter (HOSPITAL_COMMUNITY): Payer: Self-pay | Admitting: Emergency Medicine

## 2016-10-10 ENCOUNTER — Emergency Department (HOSPITAL_COMMUNITY): Payer: Medicaid Other

## 2016-10-10 ENCOUNTER — Emergency Department (HOSPITAL_COMMUNITY)
Admission: EM | Admit: 2016-10-10 | Discharge: 2016-10-10 | Disposition: A | Discharge status: Home / Self Care | Payer: Medicaid Other | Attending: Emergency Medicine | Admitting: Emergency Medicine

## 2016-10-10 DIAGNOSIS — S0083XA Contusion of other part of head, initial encounter: Secondary | ICD-10-CM | POA: Diagnosis not present

## 2016-10-10 DIAGNOSIS — I10 Essential (primary) hypertension: Secondary | ICD-10-CM | POA: Insufficient documentation

## 2016-10-10 DIAGNOSIS — Z79899 Other long term (current) drug therapy: Secondary | ICD-10-CM | POA: Insufficient documentation

## 2016-10-10 DIAGNOSIS — Y929 Unspecified place or not applicable: Secondary | ICD-10-CM | POA: Insufficient documentation

## 2016-10-10 DIAGNOSIS — S0990XA Unspecified injury of head, initial encounter: Secondary | ICD-10-CM

## 2016-10-10 DIAGNOSIS — Y939 Activity, unspecified: Secondary | ICD-10-CM | POA: Diagnosis not present

## 2016-10-10 DIAGNOSIS — Y999 Unspecified external cause status: Secondary | ICD-10-CM | POA: Insufficient documentation

## 2016-10-10 HISTORY — DX: Essential (primary) hypertension: I10
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
## 2016-10-10 NOTE — ED Notes (Signed)
Pt. Ambulatory unassisted to restroom at this time.

## 2017-01-28 ENCOUNTER — Emergency Department (HOSPITAL_COMMUNITY): Payer: Medicaid Other

## 2017-01-28 ENCOUNTER — Encounter (HOSPITAL_COMMUNITY): Payer: Self-pay | Admitting: Emergency Medicine

## 2017-01-28 ENCOUNTER — Emergency Department (HOSPITAL_COMMUNITY)
Admission: EM | Admit: 2017-01-28 | Discharge: 2017-01-29 | Disposition: A | Discharge status: Home / Self Care | Payer: Medicaid Other | Attending: Emergency Medicine | Admitting: Emergency Medicine

## 2017-01-28 DIAGNOSIS — J028 Acute pharyngitis due to other specified organisms: Secondary | ICD-10-CM

## 2017-01-28 DIAGNOSIS — N739 Female pelvic inflammatory disease, unspecified: Secondary | ICD-10-CM | POA: Diagnosis not present

## 2017-01-28 DIAGNOSIS — J029 Acute pharyngitis, unspecified: Secondary | ICD-10-CM | POA: Insufficient documentation

## 2017-01-28 DIAGNOSIS — N730 Acute parametritis and pelvic cellulitis: Secondary | ICD-10-CM

## 2017-01-28 DIAGNOSIS — R102 Pelvic and perineal pain: Secondary | ICD-10-CM

## 2017-01-28 DIAGNOSIS — B9689 Other specified bacterial agents as the cause of diseases classified elsewhere: Secondary | ICD-10-CM

## 2017-01-28 DIAGNOSIS — Z79899 Other long term (current) drug therapy: Secondary | ICD-10-CM | POA: Insufficient documentation

## 2017-01-28 DIAGNOSIS — N760 Acute vaginitis: Secondary | ICD-10-CM | POA: Diagnosis not present

## 2017-01-28 LAB — URINALYSIS, ROUTINE W REFLEX MICROSCOPIC
Bilirubin Urine: NEGATIVE
Glucose, UA: NEGATIVE mg/dL
KETONES UR: NEGATIVE mg/dL
Nitrite: NEGATIVE
PH: 6 (ref 5.0–8.0)
Protein, ur: 30 mg/dL — AB
SPECIFIC GRAVITY, URINE: 1.028 (ref 1.005–1.030)

## 2017-01-28 LAB — CBC WITH DIFFERENTIAL/PLATELET
Basophils Absolute: 0 10*3/uL (ref 0.0–0.1)
Basophils Relative: 0 %
Eosinophils Absolute: 0.2 10*3/uL (ref 0.0–0.7)
Eosinophils Relative: 2 %
HEMATOCRIT: 37.4 % (ref 36.0–46.0)
HEMOGLOBIN: 11.7 g/dL — AB (ref 12.0–15.0)
LYMPHS ABS: 1.9 10*3/uL (ref 0.7–4.0)
LYMPHS PCT: 14 %
MCH: 26.3 pg (ref 26.0–34.0)
MCHC: 31.3 g/dL (ref 30.0–36.0)
MCV: 84 fL (ref 78.0–100.0)
Monocytes Absolute: 0.6 10*3/uL (ref 0.1–1.0)
Monocytes Relative: 5 %
NEUTROS ABS: 10.2 10*3/uL — AB (ref 1.7–7.7)
NEUTROS PCT: 79 %
Platelets: 340 10*3/uL (ref 150–400)
RBC: 4.45 MIL/uL (ref 3.87–5.11)
RDW: 13.5 % (ref 11.5–15.5)
WBC: 12.8 10*3/uL — AB (ref 4.0–10.5)

## 2017-01-28 LAB — COMPREHENSIVE METABOLIC PANEL
ALK PHOS: 72 U/L (ref 38–126)
ALT: 12 U/L — AB (ref 14–54)
AST: 18 U/L (ref 15–41)
Albumin: 3.5 g/dL (ref 3.5–5.0)
Anion gap: 6 (ref 5–15)
CALCIUM: 9.1 mg/dL (ref 8.9–10.3)
CO2: 28 mmol/L (ref 22–32)
CREATININE: 0.85 mg/dL (ref 0.44–1.00)
Chloride: 105 mmol/L (ref 101–111)
Glucose, Bld: 105 mg/dL — ABNORMAL HIGH (ref 65–99)
Potassium: 3.4 mmol/L — ABNORMAL LOW (ref 3.5–5.1)
Sodium: 139 mmol/L (ref 135–145)
Total Bilirubin: 0.4 mg/dL (ref 0.3–1.2)
Total Protein: 7.8 g/dL (ref 6.5–8.1)

## 2017-01-28 LAB — WET PREP, GENITAL
Sperm: NONE SEEN
TRICH WET PREP: NONE SEEN
YEAST WET PREP: NONE SEEN

## 2017-01-28 LAB — RAPID STREP SCREEN (MED CTR MEBANE ONLY): STREPTOCOCCUS, GROUP A SCREEN (DIRECT): NEGATIVE

## 2017-01-28 LAB — I-STAT BETA HCG BLOOD, ED (MC, WL, AP ONLY)

## 2017-01-28 NOTE — ED Triage Notes (Signed)
Pt to ED with c/o sore throat x's 3 days also c/o lower abd pain at pelvic region x's 3 days.  Pt denies any vaginal discharge.  Pt c/o nausea with no vomiting

## 2017-01-28 NOTE — ED Provider Notes (Signed)
[facility] Provider Note   CSN: [id number] Arrival date & time: 01/28/17  1922     History   Chief Complaint Chief Complaint  Patient presents with  . Abdominal Pain  . Sore Throat    HPI Christina Ingram is a 21 y.o. female.  HPI   Christina Ingram is a 21 y.o. female, with a history of HTN, presenting to the ED with complaints of Pelvic pain and throat pain. Pelvic pain began a couple weeks ago. Cramping and stabbing in nature, intermittent, 9/10, radiates to abdomen. Also endorses urinary frequency and urgency as well as dyspareunia.   Throat pain beginning a week ago. Bilateral, sharp and constant, 9/10, nonradiating. Accompanied by nasal congestion. Denies difficulty breathing or swallowing, rash, or facial or neck swelling. Has tried tylenol and Ibuprofen without relief. No known sick contacts.   Denies vaginal discharge or odor, vaginal pain, dysuria, fever/chills, N/V/D, or any other complaints.   Sexually active with one female partner. LMP April 5.   Past Medical History:  Diagnosis Date  . Hypertension   . Medical history non-contributory     Patient Active Problem List   Diagnosis Date Noted  . Nexplanon insertion 06/18/2015  . 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 IUGR (intrauterine growth restriction) 04/21/2015    Past Surgical History:  Procedure Laterality Date  . WISDOM TOOTH EXTRACTION      OB History    Gravida Para Term Preterm AB Living   1 1 1     1    SAB TAB Ectopic Multiple Live Births         0 1       Home Medications    Prior to Admission medications   Medication Sig Start Date End Date Taking? Authorizing Provider  benzonatate (TESSALON) 100 MG capsule Take 1 capsule (100 mg total) by mouth every 8 (eight) hours. Patient not taking: Reported on 01/29/2017 10/21/15   Catha GosselinHanna Patel-Mills, PA-C  butalbital-acetaminophen-caffeine (FIORICET/CODEINE) 50-325-40-30 MG capsule Take 1 capsule by mouth every 4 (four) hours as needed  for headache. Patient not taking: Reported on 01/29/2017 01/20/16   Ivery QualeHobson Bryant, PA-C  doxycycline (VIBRAMYCIN) 100 MG capsule Take 1 capsule (100 mg total) by mouth 2 (two) times daily. 01/29/17   Shawn C Joy, PA-C  escitalopram (LEXAPRO) 10 MG tablet Take 1 tablet (10 mg total) by mouth daily. Patient not taking: Reported on 01/29/2017 06/18/15   Cheral MarkerKimberly R Booker, CNM  ibuprofen (ADVIL,MOTRIN) 800 MG tablet Take 1 tablet (800 mg total) by mouth 3 (three) times daily. Patient not taking: Reported on 01/29/2017 07/12/16   Ivery QualeHobson Bryant, PA-C  methocarbamol (ROBAXIN) 500 MG tablet Take 1 tablet (500 mg total) by mouth 3 (three) times daily. Patient not taking: Reported on 01/29/2017 07/12/16   Ivery QualeHobson Bryant, PA-C  metroNIDAZOLE (FLAGYL) 500 MG tablet Take 1 tablet (500 mg total) by mouth 2 (two) times daily. 01/29/17   Shawn C Joy, PA-C  prenatal vitamin w/FE, FA (PRENATAL 1 + 1) 27-1 MG TABS tablet TAKE ONE TABLET BY MOUTH ONCE DAILY AT  NOON Patient not taking: Reported on 01/29/2017 03/09/16   Tilda BurrowJohn Ferguson V, MD  promethazine (PHENERGAN) 12.5 MG tablet Take 1 tablet (12.5 mg total) by mouth every 6 (six) hours as needed for nausea or vomiting. Patient not taking: Reported on 01/29/2017 01/20/16   Ivery QualeHobson Bryant, PA-C  traMADol (ULTRAM) 50 MG tablet Take 1 tablet (50 mg total) by mouth every 6 (six) hours as needed. Patient not taking:  Reported on 01/29/2017 07/12/16   Ivery Quale, PA-C    Family History Family History  Problem Relation Age of Onset  . Cancer Mother   . Clotting disorder Mother   . Venous thrombosis Mother   . Hypertension Mother   . Diabetes Maternal Grandmother   . Hypertension Maternal Grandmother   . Diabetes Maternal Grandfather     Social History Social History  Substance Use Topics  . Smoking status: Never Smoker  . Smokeless tobacco: Never Used  . Alcohol use No     Allergies   Penicillins   Review of Systems Review of Systems  Constitutional: Negative for  chills, diaphoresis and fever.  HENT: Positive for congestion and sore throat. Negative for facial swelling, trouble swallowing and voice change.   Respiratory: Negative for shortness of breath.   Gastrointestinal: Positive for abdominal pain. Negative for diarrhea, nausea and vomiting.  Genitourinary: Positive for frequency and urgency. Negative for flank pain, vaginal bleeding, vaginal discharge and vaginal pain.  Musculoskeletal: Negative for back pain.  All other systems reviewed and are negative.    Physical Exam Updated Vital Signs BP 117/82   Pulse 97   Temp 98.1 F (36.7 C) (Oral)   Resp (!) 22   Ht 5\' 1"  (1.549 m)   Wt 86.2 kg   LMP 12/31/2016 (Exact Date)   SpO2 100%   BMI 35.90 kg/m   Physical Exam  Constitutional: She appears well-developed and well-nourished. No distress.  HENT:  Head: Normocephalic and atraumatic.  Nose: Mucosal edema and rhinorrhea present.  Mouth/Throat: Uvula is midline and mucous membranes are normal. Oropharyngeal exudate, posterior oropharyngeal edema and posterior oropharyngeal erythema present.  Eyes: Conjunctivae are normal.  Neck: Neck supple.  Cardiovascular: Normal rate, regular rhythm, normal heart sounds and intact distal pulses.   Pulmonary/Chest: Effort normal and breath sounds normal. No respiratory distress.  Abdominal: Soft. There is tenderness in the suprapubic area. There is no guarding and no CVA tenderness.  Genitourinary: Cervix exhibits friability. Right adnexum displays tenderness. Left adnexum displays tenderness.  Genitourinary Comments: External genitalia normal Vagina with discharge - Moderate to large amount of yellow-green discharge Cervix  abnormal  - cervix is erythematous and friable negative for cervical motion tenderness Adnexa palpated, no masses, positive for bilateral tenderness noted Bladder palpated positive for tenderness Uterus palpated no masses, negative for tenderness  Otherwise normal female  genitalia. RN, Illene Labrador, served as Biomedical engineer during exam.  Musculoskeletal: She exhibits no edema.  Lymphadenopathy:    She has no cervical adenopathy.       Right: No inguinal adenopathy present.       Left: No inguinal adenopathy present.  Neurological: She is alert.  Skin: Skin is warm and dry. She is not diaphoretic.  Psychiatric: She has a normal mood and affect. Her behavior is normal.  Nursing note and vitals reviewed.    ED Treatments / Results  Labs (all labs ordered are listed, but only abnormal results are displayed) Labs Reviewed  WET PREP, GENITAL - Abnormal; Notable for the following:       Result Value   Clue Cells Wet Prep HPF POC PRESENT (*)    WBC, Wet Prep HPF POC MANY (*)    All other components within normal limits  CBC WITH DIFFERENTIAL/PLATELET - Abnormal; Notable for the following:    WBC 12.8 (*)    Hemoglobin 11.7 (*)    Neutro Abs 10.2 (*)    All other components within normal limits  COMPREHENSIVE METABOLIC  PANEL - Abnormal; Notable for the following:    Potassium 3.4 (*)    Glucose, Bld 105 (*)    BUN <5 (*)    ALT 12 (*)    All other components within normal limits  URINALYSIS, ROUTINE W REFLEX MICROSCOPIC - Abnormal; Notable for the following:    APPearance CLOUDY (*)    Hgb urine dipstick MODERATE (*)    Protein, ur 30 (*)    Leukocytes, UA LARGE (*)    Bacteria, UA FEW (*)    Squamous Epithelial / LPF 6-30 (*)    All other components within normal limits  RAPID STREP SCREEN (NOT AT [facility])  URINE CULTURE  CULTURE, GROUP A STREP ([facility])  RPR  HIV ANTIBODY (ROUTINE TESTING)  I-STAT BETA HCG BLOOD, ED ([facility], [facility], [facility] ONLY)  GC/CHLAMYDIA PROBE AMP ([place]) NOT AT [facility]    EKG  EKG Interpretation None       Radiology No results found.  Procedures Pelvic exam Date/Time: 01/28/2017 11:06 PM Performed by: Anselm Pancoast Authorized by: Harolyn Rutherford C  Consent: Verbal consent obtained. Risks and benefits: risks, benefits and alternatives  were discussed Consent given by: patient Patient understanding: patient states understanding of the procedure being performed Patient consent: the patient's understanding of the procedure matches consent given Procedure consent: procedure consent matches procedure scheduled Patient identity confirmed: verbally with patient and arm band Local anesthesia used: no  Anesthesia: Local anesthesia used: no  Sedation: Patient sedated: no Patient tolerance: Patient tolerated the procedure well with no immediate complications    (including critical care time)  Medications Ordered in ED Medications  sodium chloride 0.9 % bolus 1,000 mL (not administered)  cefTRIAXone (ROCEPHIN) injection 250 mg (not administered)  doxycycline (VIBRA-TABS) tablet 100 mg (not administered)     Initial Impression / Assessment and Plan / ED Course  I have reviewed the triage vital signs and the nursing notes.  Pertinent labs & imaging results that were available during my care of the patient were reviewed by me and considered in my medical decision making (see chart for details).       Patient presents with pelvic pain and pharyngitis. UA results were not wholly convincing of UTI, thus a pelvic exam was performed. Suspicion for possible PID due to pelvic exam findings, leukocytosis, and mild tachycardia. Low suspicion for sepsis in this patient and she is nontoxic appearing. Her tachycardia and leukocytosis could easily stem from her pharyngitis. Patient also has evidence of BV on wet prep.  End of shift patient care handoff report given to [employer], PA-C. Plan: [place] results pending. Discharge with instructions for PID and BV. OBGYN follow up.    Vitals:   01/28/17 2245 01/28/17 2300 01/28/17 2330 01/29/17 0045  BP: 112/81 119/82 122/77 112/65  Pulse: 95 (!) 102 (!) 105   Resp:      Temp:      TempSrc:      SpO2: 100% 100% 100%   Weight:      Height:         Final Clinical Impressions(s) / ED  Diagnoses   Final diagnoses:  Pelvic pain  Pharyngitis due to other organism  BV (bacterial vaginosis)    New Prescriptions New Prescriptions   DOXYCYCLINE (VIBRAMYCIN) 100 MG CAPSULE    Take 1 capsule (100 mg total) by mouth 2 (two) times daily.   METRONIDAZOLE (FLAGYL) 500 MG TABLET    Take 1 tablet (500 mg total) by mouth 2 (two) times  daily.     Anselm Pancoast, PA-C 01/29/17 0120    Melene Plan, DO 01/30/17 [phone]

## 2017-01-28 NOTE — ED Notes (Signed)
Going to u/s

## 2017-01-29 LAB — HIV ANTIBODY (ROUTINE TESTING W REFLEX): HIV Screen 4th Generation wRfx: NONREACTIVE

## 2017-01-29 LAB — GC/CHLAMYDIA PROBE AMP (~~LOC~~) NOT AT ARMC
Chlamydia: POSITIVE — AB
Neisseria Gonorrhea: NEGATIVE

## 2017-01-29 LAB — RPR: RPR Ser Ql: NONREACTIVE

## 2017-01-29 MED ORDER — AZITHROMYCIN 250 MG PO TABS
2000.0000 mg | ORAL_TABLET | Freq: Once | ORAL | Status: AC
  Administered 2017-01-29: 2000 mg via ORAL
  Filled 2017-01-29: qty 8

## 2017-01-29 MED ORDER — ONDANSETRON HCL 4 MG PO TABS: 4.0000 mg | ORAL_TABLET | Freq: Four times a day (QID) | ORAL | 0 refills | Status: DC

## 2017-01-29 MED ORDER — CEFTRIAXONE SODIUM 250 MG IJ SOLR: 250.0000 mg | Freq: Once | INTRAMUSCULAR | Status: DC

## 2017-01-29 MED ORDER — LEVOFLOXACIN 500 MG PO TABS: 500.0000 mg | ORAL_TABLET | Freq: Every day | ORAL | 0 refills | Status: DC

## 2017-01-29 MED ORDER — DOXYCYCLINE HYCLATE 100 MG PO CAPS: 100.0000 mg | ORAL_CAPSULE | Freq: Two times a day (BID) | ORAL | 0 refills | Status: DC

## 2017-01-29 MED ORDER — ONDANSETRON 4 MG PO TBDP
4.0000 mg | ORAL_TABLET | Freq: Once | ORAL | Status: AC
  Administered 2017-01-29: 4 mg via ORAL
  Filled 2017-01-29: qty 1

## 2017-01-29 MED ORDER — DOXYCYCLINE HYCLATE 100 MG PO TABS
100.0000 mg | ORAL_TABLET | Freq: Once | ORAL | Status: DC
  Filled 2017-01-29: qty 1

## 2017-01-29 MED ORDER — METRONIDAZOLE 500 MG PO TABS: 500.0000 mg | ORAL_TABLET | Freq: Two times a day (BID) | ORAL | 0 refills | Status: DC

## 2017-01-29 MED ORDER — ONDANSETRON HCL 4 MG/2ML IJ SOLN
4.0000 mg | Freq: Once | INTRAMUSCULAR | Status: AC
  Administered 2017-01-29: 4 mg via INTRAVENOUS
  Filled 2017-01-29: qty 2

## 2017-01-29 MED ORDER — AZITHROMYCIN 250 MG PO TABS: 1000.0000 mg | ORAL_TABLET | Freq: Once | ORAL | Status: DC

## 2017-01-29 MED ORDER — AZITHROMYCIN 250 MG PO TABS: 2000.0000 mg | ORAL_TABLET | Freq: Once | ORAL | 0 refills | Status: AC

## 2017-01-29 MED ORDER — SODIUM CHLORIDE 0.9 % IV BOLUS (SEPSIS)
1000.0000 mL | Freq: Once | INTRAVENOUS | Status: AC
  Administered 2017-01-29: 1000 mL via INTRAVENOUS

## 2017-01-29 NOTE — ED Notes (Signed)
Pt returned from u/s

## 2017-01-29 NOTE — ED Notes (Signed)
One unsuccessful attempt to start iv 

## 2017-01-29 NOTE — ED Notes (Signed)
Patient transported to Ultrasound 

## 2017-01-29 NOTE — ED Provider Notes (Signed)
Patient signed out to me pending ultrasound. Ultrasound shows changes consistent with PID, but also shows possible tubo-ovarian phlegmon versus abscess.  I discussed patient with [facility], who recommends treatment for PID and close follow-up in their outpatient clinic. No transfer hospitalization at this time. Patient is tolerating oral intake. She is pen allergic, will treat with 2 g azithromycin and Levaquin 14 days. Patient understands and agrees the plan. Return precautions given. She is stable and ready for discharge.   Roxy Horsemanobert Preslee Regas, PA-C 01/29/17 0411    Melene PlanFloyd, Dan, DO 01/30/17 [phone]

## 2017-01-29 NOTE — ED Notes (Signed)
Pt nauseated and spitting up mucous  Back to us

## 2017-01-29 NOTE — Discharge Instructions (Addendum)
[facility] must call [facility] in the morning to schedule a close follow-up appointment.  Return if your symptoms worsen.   Pelvic Pain: You have been tested for STDs. Some of these results are still pending. Any abnormalities will be called to you. You have been prophylactically treated for gonorrhea and Chlamydia. This does not mean you necessarily have these diseases, treatment is precautionary, however, you do have some symptoms consistent with an infection in the pelvis and therefore we will be treating you for a condition called pelvic inflammatory disease. Be sure to follow safe sex practices, including monogamy and/or condom use. Follow up with OBGYN on this matter.  Please take all of your antibiotics until finished!   You may develop abdominal discomfort or diarrhea from the antibiotic.  You may help offset this with probiotics which you can buy or get in yogurt. Do not eat or take the probiotics until 2 hours after your antibiotic.   Sore throat and congestion: Your strep test was negative. Your symptoms are therefore consistent with a viral illness. Viruses do not require antibiotics. Treatment is symptomatic care and it is important to note that these symptoms may last for 7-14 days.   Hand washing: Wash your hands throughout the day, but especially before and after touching the face, using the restroom, sneezing, coughing, or touching surfaces that have been coughed or sneezed upon. Hydration: Symptoms will be intensified and complicated by dehydration. Dehydration can also extend the duration of symptoms. Drink plenty of fluids and get plenty of rest. You should be drinking at least half a liter of water an hour to stay hydrated. Electrolyte drinks are also encouraged. You should be drinking enough fluids to make your urine light yellow, almost clear. If this is not the case, you are not drinking enough water. Please note that some of the treatments indicated below will not be effective  if you are not adequately hydrated. Pain or fever: Ibuprofen, Naproxen, or Tylenol for pain or fever.  Congestion: Plain Mucinex may help relieve congestion. Saline sinus rinses and saline nasal sprays may also help relieve congestion.  Sore throat: Warm liquids or Chloraseptic spray may help soothe a sore throat. Gargle twice a day with a salt water solution made from a half teaspoon of salt in a cup of warm water.  Follow up: Follow up with a primary care provider, as needed, for any future management of this issue.

## 2017-01-29 NOTE — ED Notes (Signed)
Nausea med given iv.  Pt c/o throat and pelvic pain

## 2017-01-30 LAB — URINE CULTURE

## 2017-01-30 LAB — CULTURE, GROUP A STREP (THRC)

## 2017-02-19 ENCOUNTER — Encounter (HOSPITAL_COMMUNITY): Payer: Self-pay | Admitting: Student

## 2017-02-19 ENCOUNTER — Inpatient Hospital Stay (HOSPITAL_COMMUNITY)
Admission: AD | Admit: 2017-02-19 | Discharge: 2017-02-19 | Disposition: A | Discharge status: Home / Self Care | Payer: Medicaid Other | Source: Ambulatory Visit | Attending: Obstetrics & Gynecology | Admitting: Obstetrics & Gynecology

## 2017-02-19 DIAGNOSIS — N939 Abnormal uterine and vaginal bleeding, unspecified: Secondary | ICD-10-CM | POA: Insufficient documentation

## 2017-02-19 DIAGNOSIS — R109 Unspecified abdominal pain: Secondary | ICD-10-CM | POA: Insufficient documentation

## 2017-02-19 DIAGNOSIS — N898 Other specified noninflammatory disorders of vagina: Secondary | ICD-10-CM | POA: Insufficient documentation

## 2017-02-19 DIAGNOSIS — Z880 Allergy status to penicillin: Secondary | ICD-10-CM | POA: Diagnosis not present

## 2017-02-19 DIAGNOSIS — N730 Acute parametritis and pelvic cellulitis: Secondary | ICD-10-CM | POA: Diagnosis not present

## 2017-02-19 DIAGNOSIS — A749 Chlamydial infection, unspecified: Secondary | ICD-10-CM | POA: Diagnosis present

## 2017-02-19 DIAGNOSIS — Z8249 Family history of ischemic heart disease and other diseases of the circulatory system: Secondary | ICD-10-CM | POA: Insufficient documentation

## 2017-02-19 DIAGNOSIS — R300 Dysuria: Secondary | ICD-10-CM | POA: Diagnosis present

## 2017-02-19 LAB — URINALYSIS, ROUTINE W REFLEX MICROSCOPIC
Bilirubin Urine: NEGATIVE
GLUCOSE, UA: NEGATIVE mg/dL
Ketones, ur: NEGATIVE mg/dL
Leukocytes, UA: NEGATIVE
NITRITE: NEGATIVE
PH: 6 (ref 5.0–8.0)
PROTEIN: NEGATIVE mg/dL
Specific Gravity, Urine: 1.018 (ref 1.005–1.030)

## 2017-02-19 LAB — CBC
HCT: 37.5 % (ref 36.0–46.0)
HEMOGLOBIN: 11.6 g/dL — AB (ref 12.0–15.0)
MCH: 26.6 pg (ref 26.0–34.0)
MCHC: 30.9 g/dL (ref 30.0–36.0)
MCV: 86 fL (ref 78.0–100.0)
Platelets: 306 10*3/uL (ref 150–400)
RBC: 4.36 MIL/uL (ref 3.87–5.11)
RDW: 14.5 % (ref 11.5–15.5)
WBC: 7.1 10*3/uL (ref 4.0–10.5)

## 2017-02-19 LAB — POCT PREGNANCY, URINE: PREG TEST UR: NEGATIVE

## 2017-02-19 MED ORDER — AZITHROMYCIN 250 MG PO TABS
1000.0000 mg | ORAL_TABLET | Freq: Once | ORAL | Status: AC
  Administered 2017-02-19: 1000 mg via ORAL
  Filled 2017-02-19: qty 4

## 2017-02-19 NOTE — MAU Note (Signed)
LMP 01/30/2017-5/13 Then started bleeding again on 5/20 Lower mid abdominal pain x 1week off and on ; currently rating a pain 9/10 (patient eating cashews and drinking during triage) Dysuria x3 days but patient states its not with each void

## 2017-02-19 NOTE — Discharge Instructions (Signed)
Pelvic Inflammatory Disease Pelvic inflammatory disease (PID) refers to an infection in some or all of the female organs. The infection can be in the uterus, ovaries, fallopian tubes, or the surrounding tissues in the pelvis. PID can cause abdominal or pelvic pain that comes on suddenly (acute pelvic pain). PID is a serious infection because it can lead to lasting (chronic) pelvic pain or the inability to have children (infertility). What are the causes? This condition is most often caused by an infection that is spread during sexual contact. However, the infection can also be caused by the normal bacteria that are found in the vaginal tissues if these bacteria travel upward into the reproductive organs. PID can also occur following:  The birth of a baby.  A miscarriage.  An abortion.  Major pelvic surgery.  The use of an intrauterine device (IUD).  A sexual assault. What increases the risk? This condition is more likely to develop in women who:  Are younger than 23 years of age.  Are sexually active at ayoung age.  Use nonbarrier contraception.  Have multiple sexual partners.  Have sex with someone who has symptoms of an STD (sexually transmitted disease).  Use oral contraception. At times, certain behaviors can also increase the possibility of getting PID, such as:  Using a vaginal douche.  Having an IUD in place. What are the signs or symptoms? Symptoms of this condition include:  Abdominal or pelvic pain.  Fever.  Chills.  Abnormal vaginal discharge.  Abnormal uterine bleeding.  Unusual pain shortly after the end of a menstrual period.  Painful urination.  Pain with sexual intercourse.  Nausea and vomiting. How is this diagnosed? To diagnose this condition, your health care provider will do a physical exam and take your medical history. A pelvic exam typically reveals great tenderness in the uterus and the surrounding pelvic tissues. You may also have  tests, such as:  Lab tests, including a pregnancy test, blood tests, and urine test.  Culture tests of the vagina and cervix to check for an STD.  Ultrasound.  A laparoscopic procedure to look inside the pelvis.  Examining vaginal secretions under a microscope. How is this treated? Treatment for this condition may involve one or more approaches.  Antibiotic medicines may be prescribed to be taken by mouth.  Sexual partners may need to be treated if the infection is caused by an STD.  For more severe cases, hospitalization may be needed to give antibiotics directly into a vein through an IV tube.  Surgery may be needed if other treatments do not help, but this is rare. It may take weeks until you are completely well. If you are diagnosed with PID, you should also be checked for human immunodeficiency virus (HIV). Your health care provider may test you for infection again 3 months after treatment. You should not have unprotected sex. Follow these instructions at home:  Take over-the-counter and prescription medicines only as told by your health care provider.  If you were prescribed an antibiotic medicine, take it as told by your health care provider. Do not stop taking the antibiotic even if you start to feel better.  Do not have sexual intercourse until treatment is completed or as told by your health care provider. If PID is confirmed, your recent sexual partners will need treatment, especially if you had unprotected sex.  Keep all follow-up visits as told by your health care provider. This is important. Contact a health care provider if:  You have increased   or abnormal vaginal discharge.  Your pain does not improve.  You vomit.  You have a fever.  You cannot tolerate your medicines.  Your partner has an STD.  You have pain when you urinate. Get help right away if:  You have increased abdominal or pelvic pain.  You have chills.  Your symptoms are not better in 72  hours even with treatment. This information is not intended to replace advice given to you by your health care provider. Make sure you discuss any questions you have with your health care provider. Document Released: 09/14/2005 Document Revised: 02/20/2016 Document Reviewed: 10/22/2014 Elsevier Interactive Patient Education  2017 [employer]  

## 2017-02-19 NOTE — MAU Note (Signed)
Script and paper work given to significant other for treatment

## 2017-02-19 NOTE — MAU Provider Note (Signed)
History     CSN: [id number]  Arrival date and time: 02/19/17 8295  First Provider Initiated Contact with Patient 02/19/17 1847      Chief Complaint  Patient presents with  . Dysuria  . Abdominal Pain  . Vaginal Bleeding   HPI Christina Ingram is a 21 y.o. female who presents for abdominal pain & vaginal irritation. Patient was previously seen at ED 5/3; was diagnosed with PID & TOA, given antibiotics & told to f/u with [facility]. Was positive for chlamydia. Patient did not fill prescription for doxycycline & has had unprotected intercourse with her partner whom has not yet received the tx for chlamydia. Reports suprapubic cramping & vaginal irritation for the last week. Rates pain 9/10. Has not treated. Nothing makes pain better or worse. Denies dyspareunia or postcoital bleeding. Denies fever/chills, n/v, or vaginal discharge. Has had some vaginal bleeding associated with menses. States she didn't know that she was supposed to f/u & partner did not know that he was supposed to be treated d/t positive chlamydia.  Pt has nexplanon in place since 2016.   Past Medical History:  Diagnosis Date  . Hypertension     Past Surgical History:  Procedure Laterality Date  . WISDOM TOOTH EXTRACTION      Family History  Problem Relation Age of Onset  . Cancer Mother   . Clotting disorder Mother   . Venous thrombosis Mother   . Hypertension Mother   . Diabetes Maternal Grandmother   . Hypertension Maternal Grandmother   . Diabetes Maternal Grandfather     Social History  Substance Use Topics  . Smoking status: Never Smoker  . Smokeless tobacco: Never Used  . Alcohol use No    Allergies:  Allergies  Allergen Reactions  . Penicillins Rash    Has patient had a PCN reaction causing immediate rash, facial/tongue/throat swelling, SOB or lightheadedness with hypotension: Yes Has patient had a PCN reaction causing severe rash involving mucus membranes or skin necrosis: No Has patient had a  PCN reaction that required hospitalization No Has patient had a PCN reaction occurring within the last 10 years: Yes If all of the above answers are "NO", then may proceed with Cephalosporin use.     Prescriptions Prior to Admission  Medication Sig Dispense Refill Last Dose  . benzonatate (TESSALON) 100 MG capsule Take 1 capsule (100 mg total) by mouth every 8 (eight) hours. (Patient not taking: Reported on 01/29/2017) 21 capsule 0 Completed Course at Unknown time  . butalbital-acetaminophen-caffeine (FIORICET/CODEINE) 50-325-40-30 MG capsule Take 1 capsule by mouth every 4 (four) hours as needed for headache. (Patient not taking: Reported on 01/29/2017) 15 capsule 0 Completed Course at Unknown time  . doxycycline (VIBRAMYCIN) 100 MG capsule Take 1 capsule (100 mg total) by mouth 2 (two) times daily. 28 capsule 0   . escitalopram (LEXAPRO) 10 MG tablet Take 1 tablet (10 mg total) by mouth daily. (Patient not taking: Reported on 01/29/2017) 30 tablet 6 Not Taking at Unknown time  . ibuprofen (ADVIL,MOTRIN) 800 MG tablet Take 1 tablet (800 mg total) by mouth 3 (three) times daily. (Patient not taking: Reported on 01/29/2017) 21 tablet 0 Completed Course at Unknown time  . levofloxacin (LEVAQUIN) 500 MG tablet Take 1 tablet (500 mg total) by mouth daily. 14 tablet 0   . methocarbamol (ROBAXIN) 500 MG tablet Take 1 tablet (500 mg total) by mouth 3 (three) times daily. (Patient not taking: Reported on 01/29/2017) 21 tablet 0 Completed Course at Unknown  time  . metroNIDAZOLE (FLAGYL) 500 MG tablet Take 1 tablet (500 mg total) by mouth 2 (two) times daily. 14 tablet 0   . ondansetron (ZOFRAN) 4 MG tablet Take 1 tablet (4 mg total) by mouth every 6 (six) hours. 12 tablet 0   . prenatal vitamin w/FE, FA (PRENATAL 1 + 1) 27-1 MG TABS tablet TAKE ONE TABLET BY MOUTH ONCE DAILY AT  NOON (Patient not taking: Reported on 01/29/2017) 30 tablet prn Not Taking at Unknown time  . promethazine (PHENERGAN) 12.5 MG tablet Take 1  tablet (12.5 mg total) by mouth every 6 (six) hours as needed for nausea or vomiting. (Patient not taking: Reported on 01/29/2017) 10 tablet 0 Completed Course at Unknown time  . traMADol (ULTRAM) 50 MG tablet Take 1 tablet (50 mg total) by mouth every 6 (six) hours as needed. (Patient not taking: Reported on 01/29/2017) 15 tablet 0 Completed Course at Unknown time    Review of Systems  Constitutional: Negative.  Negative for chills and fever.  Gastrointestinal: Positive for abdominal pain. Negative for constipation, diarrhea, nausea and vomiting.  Genitourinary: Positive for dysuria and vaginal bleeding. Negative for dyspareunia and vaginal discharge.   Physical Exam   Blood pressure 114/89, pulse 74, temperature 98.2 F (36.8 C), temperature source Oral, resp. rate 20, weight 179 lb (81.2 kg), last menstrual period 01/30/2017, SpO2 99 %, not currently breastfeeding.  Physical Exam  Nursing note and vitals reviewed. Constitutional: She is oriented to person, place, and time. She appears well-developed and well-nourished. No distress.  HENT:  Head: Normocephalic and atraumatic.  Eyes: Conjunctivae are normal. Right eye exhibits no discharge. Left eye exhibits no discharge. No scleral icterus.  Neck: Normal range of motion.  Cardiovascular: Normal rate, regular rhythm and normal heart sounds.   No murmur heard. Respiratory: Effort normal and breath sounds normal. No respiratory distress. She has no wheezes.  GI: Soft. Bowel sounds are normal. There is no tenderness. There is no rebound and no guarding.  Genitourinary: Uterus normal. There is no rash, tenderness or lesion on the right labia. There is no rash, tenderness or lesion on the left labia. Cervix exhibits no motion tenderness, no discharge and no friability. Right adnexum displays fullness. Right adnexum displays no tenderness. Left adnexum displays no tenderness and no fullness. There is bleeding (small amount of dark red blood  appropriate for menses) in the vagina.  Neurological: She is alert and oriented to person, place, and time.  Skin: Skin is warm and dry. She is not diaphoretic.  Psychiatric: She has a normal mood and affect. Her behavior is normal. Judgment and thought content normal.    MAU Course  Procedures Results for orders placed or performed during the hospital encounter of 02/19/17 (from the past 24 hour(s))  Urinalysis, Routine w reflex microscopic     Status: Abnormal   Collection Time: 02/19/17  4:46 PM  Result Value Ref Range   Color, Urine YELLOW YELLOW   APPearance CLEAR CLEAR   Specific Gravity, Urine 1.018 1.005 - 1.030   pH 6.0 5.0 - 8.0   Glucose, UA NEGATIVE NEGATIVE mg/dL   Hgb urine dipstick LARGE (A) NEGATIVE   Bilirubin Urine NEGATIVE NEGATIVE   Ketones, ur NEGATIVE NEGATIVE mg/dL   Protein, ur NEGATIVE NEGATIVE mg/dL   Nitrite NEGATIVE NEGATIVE   Leukocytes, UA NEGATIVE NEGATIVE   RBC / HPF TOO NUMEROUS TO COUNT 0 - 5 RBC/hpf   WBC, UA 0-5 0 - 5 WBC/hpf   Bacteria, UA  RARE (A) NONE SEEN   Squamous Epithelial / LPF 0-5 (A) NONE SEEN   Mucous PRESENT   Pregnancy, urine POC     Status: None   Collection Time: 02/19/17  4:56 PM  Result Value Ref Range   Preg Test, Ur NEGATIVE NEGATIVE  CBC     Status: Abnormal   Collection Time: 02/19/17  7:13 PM  Result Value Ref Range   WBC 7.1 4.0 - 10.5 K/uL   RBC 4.36 3.87 - 5.11 MIL/uL   Hemoglobin 11.6 (L) 12.0 - 15.0 g/dL   HCT 16.137.5 09.636.0 - 04.546.0 %   MCV 86.0 78.0 - 100.0 fL   MCH 26.6 26.0 - 34.0 pg   MCHC 30.9 30.0 - 36.0 g/dL   RDW 40.914.5 81.111.5 - 91.415.5 %   Platelets 306 150 - 400 K/uL    MDM UPT negative VSS, NAD Pt afebrile CBC -- no leukocytosis Azithromycin 1 gm PO Discussed importance of taking medication as prescribed & partner tx. Patient in no acute distress, afebrile, & no CMT. Will order f/u ultrasound (per previous recommendation) & [facility]   Assessment and Plan  A:  1. PID (acute pelvic  inflammatory disease)   2. Chlamydia    P: Discharge home Expedited partner info sheet & rx given  No intercourse x 1 week after completion of abx & after partner has been treated Patient to fill rx & take doxycycline as previously prescribed Outpatient ultrasound ordered & msg to [facility] for f/u appt If symptoms worsen or fever develops, pt to return to MAU  Judeth HornErin Braxtin Bamba 02/19/2017, 6:44 PM

## 2017-02-19 NOTE — Progress Notes (Signed)
Erin Lawrence NP in earlier to discuss test results and d/c plan. Written and verbal d/c instructions given and understanding voiced 

## 2017-02-24 ENCOUNTER — Telehealth: Payer: Self-pay | Admitting: General Practice

## 2017-02-24 NOTE — Telephone Encounter (Signed)
Per Christina HornErin Ingram, Admin pool: schedule for f/u with MD in 3 weeks to discuss results of outpatient ultrasound & reassess for PID & TOA. Clinical Pool: Outpatient gyn ultrasound ordered   Called patient and informed her of new ultrasound appt & office appt. Patient verbalized understanding & had no questions

## 2017-03-10 ENCOUNTER — Ambulatory Visit (HOSPITAL_COMMUNITY): Payer: Medicaid Other

## 2017-03-12 ENCOUNTER — Ambulatory Visit (HOSPITAL_COMMUNITY): Payer: Medicaid Other

## 2017-03-12 ENCOUNTER — Encounter: Payer: Medicaid Other | Admitting: Obstetrics & Gynecology

## 2017-03-15 ENCOUNTER — Ambulatory Visit (HOSPITAL_COMMUNITY): Payer: Medicaid Other

## 2017-03-16 ENCOUNTER — Ambulatory Visit (HOSPITAL_COMMUNITY): Payer: Medicaid Other

## 2017-03-17 ENCOUNTER — Ambulatory Visit (HOSPITAL_COMMUNITY)
Admission: RE | Admit: 2017-03-17 | Discharge: 2017-03-17 | Disposition: A | Discharge status: Home / Self Care | Payer: Medicaid Other | Source: Ambulatory Visit | Attending: Student | Admitting: Student

## 2017-03-17 DIAGNOSIS — N83291 Other ovarian cyst, right side: Secondary | ICD-10-CM | POA: Diagnosis not present

## 2017-03-17 DIAGNOSIS — A749 Chlamydial infection, unspecified: Secondary | ICD-10-CM | POA: Diagnosis not present

## 2017-03-17 DIAGNOSIS — N83202 Unspecified ovarian cyst, left side: Secondary | ICD-10-CM | POA: Diagnosis not present

## 2017-03-17 DIAGNOSIS — N730 Acute parametritis and pelvic cellulitis: Secondary | ICD-10-CM | POA: Diagnosis present

## 2017-05-04 ENCOUNTER — Emergency Department (HOSPITAL_COMMUNITY)
Admission: EM | Admit: 2017-05-04 | Discharge: 2017-05-04 | Disposition: A | Discharge status: Home / Self Care | Payer: Medicaid Other | Attending: Emergency Medicine | Admitting: Emergency Medicine

## 2017-05-04 ENCOUNTER — Emergency Department (HOSPITAL_COMMUNITY): Payer: Medicaid Other

## 2017-05-04 ENCOUNTER — Encounter (HOSPITAL_COMMUNITY): Payer: Self-pay | Admitting: Emergency Medicine

## 2017-05-04 DIAGNOSIS — Y939 Activity, unspecified: Secondary | ICD-10-CM | POA: Insufficient documentation

## 2017-05-04 DIAGNOSIS — I10 Essential (primary) hypertension: Secondary | ICD-10-CM | POA: Diagnosis not present

## 2017-05-04 DIAGNOSIS — S0990XA Unspecified injury of head, initial encounter: Secondary | ICD-10-CM | POA: Diagnosis present

## 2017-05-04 DIAGNOSIS — Y998 Other external cause status: Secondary | ICD-10-CM | POA: Diagnosis not present

## 2017-05-04 DIAGNOSIS — R51 Headache: Secondary | ICD-10-CM | POA: Insufficient documentation

## 2017-05-04 DIAGNOSIS — S161XXA Strain of muscle, fascia and tendon at neck level, initial encounter: Secondary | ICD-10-CM | POA: Diagnosis not present

## 2017-05-04 DIAGNOSIS — M545 Low back pain, unspecified: Secondary | ICD-10-CM

## 2017-05-04 DIAGNOSIS — Y92410 Unspecified street and highway as the place of occurrence of the external cause: Secondary | ICD-10-CM | POA: Diagnosis not present

## 2017-05-04 DIAGNOSIS — R0789 Other chest pain: Secondary | ICD-10-CM

## 2017-05-04 LAB — I-STAT BETA HCG BLOOD, ED (MC, WL, AP ONLY)

## 2017-05-04 MED ORDER — IBUPROFEN 800 MG PO TABS: 800.0000 mg | ORAL_TABLET | Freq: Three times a day (TID) | ORAL | 0 refills | Status: DC | PRN

## 2017-05-04 MED ORDER — CYCLOBENZAPRINE HCL 5 MG PO TABS: 5.0000 mg | ORAL_TABLET | Freq: Three times a day (TID) | ORAL | 0 refills | Status: DC | PRN

## 2017-05-04 MED ORDER — KETOROLAC TROMETHAMINE 30 MG/ML IJ SOLN
30.0000 mg | Freq: Once | INTRAMUSCULAR | Status: AC
  Administered 2017-05-04: 30 mg via INTRAMUSCULAR
  Filled 2017-05-04: qty 1
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
## 2017-05-04 NOTE — ED Notes (Signed)
Patient transported to CT 

## 2017-05-04 NOTE — Discharge Instructions (Signed)
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
## 2017-12-27 ENCOUNTER — Encounter: Payer: Self-pay | Admitting: Women's Health

## 2017-12-27 ENCOUNTER — Ambulatory Visit: Payer: Medicaid Other | Admitting: Women's Health

## 2017-12-27 VITALS — BP 118/60 | HR 98 | Temp 98.5°F | Ht 61.0 in | Wt 190.0 lb

## 2017-12-27 DIAGNOSIS — H6693 Otitis media, unspecified, bilateral: Secondary | ICD-10-CM

## 2017-12-27 DIAGNOSIS — Z3049 Encounter for surveillance of other contraceptives: Secondary | ICD-10-CM

## 2017-12-27 DIAGNOSIS — Z3046 Encounter for surveillance of implantable subdermal contraceptive: Secondary | ICD-10-CM

## 2017-12-27 DIAGNOSIS — Z30017 Encounter for initial prescription of implantable subdermal contraceptive: Secondary | ICD-10-CM

## 2017-12-27 DIAGNOSIS — J069 Acute upper respiratory infection, unspecified: Secondary | ICD-10-CM

## 2017-12-27 MED ORDER — AZITHROMYCIN 250 MG PO TABS: ORAL_TABLET | ORAL | 0 refills | Status: DC

## 2017-12-27 MED ORDER — CITRANATAL ASSURE 35-1 & 300 MG PO MISC: ORAL | 11 refills | Status: DC

## 2017-12-27 NOTE — Progress Notes (Signed)
   NEXPLANON REMOVAL Patient name: Christina Ingram MRN [id number]  Date of birth: 03/30/1996 Subjective Findings:   Christina Ingram is a 22 y.o. 611P1001 African American female being seen today for removal of a Nexplanon. Her Nexplanon was placed 06/18/15.  She desires removal because she wants to get pregnant. Signed copy of informed consent in chart.  Also reports cold sx x about a month. Sore throat, congestion, productive cough, bilateral ear pain, feels it is getting worse. Denies fever/chills.   Patient's last menstrual period was 12/08/2017. Last papnever. Results were:  n/a The planned method of family planning is none Pertinent History Reviewed:   Reviewed past medical,surgical, social, obstetrical and family history.  Reviewed problem list, medications and allergies. Objective Findings & Procedure:    Vitals:   12/27/17 1405  BP: 118/60  Pulse: 98  Temp: 98.5 F (36.9 C)  Weight: 190 lb (86.2 kg)  Height: 5\' 1"  (1.549 m)  Body mass index is 35.9 kg/m.  General appearance - alert, well appearing, and in no distress Mental status - alert, oriented to person, place, and time Ears - bilateral membranes erythematous and bulging Mouth - mucous membranes moist, pharynx normal without lesions Neck - lymphadenopathy Chest - clear to auscultation, no wheezes, rales or rhonchi, symmetric air entry Heart - normal rate and regular rhythm   No results found for this or any previous visit (from the past 24 hour(s)).   Time out was performed.  Nexplanon site identified.  Area prepped in usual sterile fashon. One cc of 2% lidocaine was used to anesthetize the area at the distal end of the implant. A small stab incision was made right beside the implant on the distal portion.  The Nexplanon rod was grasped using hemostats and removed without difficulty.  There was less than 3 cc blood loss. There were no complications.  Steri-strips were applied over the small incision and a pressure  bandage was applied.  The patient tolerated the procedure well. Assessment & Plan:   1) Nexplanon removal She was instructed to keep the area clean and dry, remove pressure bandage in 24 hours, and keep insertion site covered with the steri-strip for 3-5 days.   Follow-up PRN problems.  2) URI w/ bilateral otitis media> rx zpak (allergic to pcn)  No orders of the defined types were placed in this encounter.   Follow-up: Return for w/in 4wks for pap & physical.  Cheral MarkerKimberly R Yasenia Reedy CNM, [facility]vd[facility]NP-BC 12/27/2017 3:24 PM

## 2018-01-18 ENCOUNTER — Other Ambulatory Visit: Payer: Self-pay

## 2018-01-18 ENCOUNTER — Emergency Department (HOSPITAL_COMMUNITY)
Admission: EM | Admit: 2018-01-18 | Discharge: 2018-01-18 | Disposition: A | Discharge status: Home / Self Care | Payer: Medicaid Other | Attending: Emergency Medicine | Admitting: Emergency Medicine

## 2018-01-18 ENCOUNTER — Encounter (HOSPITAL_COMMUNITY): Payer: Self-pay | Admitting: Emergency Medicine

## 2018-01-18 DIAGNOSIS — Y939 Activity, unspecified: Secondary | ICD-10-CM | POA: Diagnosis not present

## 2018-01-18 DIAGNOSIS — S20409A Unspecified superficial injuries of unspecified back wall of thorax, initial encounter: Secondary | ICD-10-CM | POA: Diagnosis present

## 2018-01-18 DIAGNOSIS — L02412 Cutaneous abscess of left axilla: Secondary | ICD-10-CM | POA: Insufficient documentation

## 2018-01-18 DIAGNOSIS — S46912A Strain of unspecified muscle, fascia and tendon at shoulder and upper arm level, left arm, initial encounter: Secondary | ICD-10-CM | POA: Diagnosis not present

## 2018-01-18 DIAGNOSIS — Y999 Unspecified external cause status: Secondary | ICD-10-CM | POA: Insufficient documentation

## 2018-01-18 DIAGNOSIS — R0789 Other chest pain: Secondary | ICD-10-CM | POA: Diagnosis not present

## 2018-01-18 DIAGNOSIS — I10 Essential (primary) hypertension: Secondary | ICD-10-CM | POA: Diagnosis not present

## 2018-01-18 DIAGNOSIS — X58XXXA Exposure to other specified factors, initial encounter: Secondary | ICD-10-CM | POA: Diagnosis not present

## 2018-01-18 DIAGNOSIS — Y929 Unspecified place or not applicable: Secondary | ICD-10-CM | POA: Diagnosis not present

## 2018-01-18 MED ORDER — CYCLOBENZAPRINE HCL 10 MG PO TABS: 10.0000 mg | ORAL_TABLET | Freq: Three times a day (TID) | ORAL | 0 refills | Status: DC

## 2018-01-18 MED ORDER — IBUPROFEN 800 MG PO TABS
800.0000 mg | ORAL_TABLET | Freq: Once | ORAL | Status: AC
  Administered 2018-01-18: 800 mg via ORAL
  Filled 2018-01-18: qty 1

## 2018-01-18 MED ORDER — IBUPROFEN 600 MG PO TABS: 600.0000 mg | ORAL_TABLET | Freq: Four times a day (QID) | ORAL | 0 refills | Status: DC

## 2018-01-18 MED ORDER — CEPHALEXIN 500 MG PO CAPS
500.0000 mg | ORAL_CAPSULE | Freq: Once | ORAL | Status: AC
  Administered 2018-01-18: 500 mg via ORAL
  Filled 2018-01-18: qty 1

## 2018-01-18 MED ORDER — ONDANSETRON HCL 4 MG PO TABS
4.0000 mg | ORAL_TABLET | Freq: Once | ORAL | Status: AC
  Administered 2018-01-18: 4 mg via ORAL
  Filled 2018-01-18: qty 1

## 2018-01-18 MED ORDER — TRAMADOL HCL 50 MG PO TABS
100.0000 mg | ORAL_TABLET | Freq: Once | ORAL | Status: AC
  Administered 2018-01-18: 100 mg via ORAL
  Filled 2018-01-18: qty 2

## 2018-01-18 MED ORDER — TRAMADOL HCL 50 MG PO TABS: 50.0000 mg | ORAL_TABLET | Freq: Four times a day (QID) | ORAL | 0 refills | Status: DC | PRN

## 2018-01-18 MED ORDER — DOXYCYCLINE HYCLATE 100 MG PO CAPS: 100.0000 mg | ORAL_CAPSULE | Freq: Two times a day (BID) | ORAL | 0 refills | Status: DC

## 2018-01-18 MED ORDER — DOXYCYCLINE HYCLATE 100 MG PO TABS
100.0000 mg | ORAL_TABLET | Freq: Once | ORAL | Status: AC
  Administered 2018-01-18: 100 mg via ORAL
  Filled 2018-01-18: qty 1

## 2018-01-18 MED ORDER — CYCLOBENZAPRINE HCL 10 MG PO TABS
10.0000 mg | ORAL_TABLET | Freq: Once | ORAL | Status: AC
  Administered 2018-01-18: 10 mg via ORAL
  Filled 2018-01-18: qty 1

## 2018-01-18 NOTE — ED Provider Notes (Signed)
[facility] EMERGENCY DEPARTMENT Provider Note   CSN: [id number] Arrival date & time: 01/18/18  1633     History   Chief Complaint Chief Complaint  Patient presents with  . Back Pain  . Neck Pain    HPI Christina Ingram is a 22 y.o. female.  Patient is a 22 year old female who presents to the emergency department with a complaint of back pain and neck pain.  Patient states this problems been going on for about for 5 days.  It is aggravated by certain movements, and when she attempts to lift, push, or pull.  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  She is not had any difficulty with dropping objects.  No numbness or tingling of the upper extremities.  No previous history of neck or back area surgeries or injuries.  She presents now because of increasing pain involving the back and the neck.     Past Medical History:  Diagnosis Date  . Hypertension     Patient Active Problem List   Diagnosis Date Noted  . Nexplanon insertion 06/18/2015  . 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 IUGR (intrauterine growth restriction) 04/21/2015    Past Surgical History:  Procedure Laterality Date  . WISDOM TOOTH EXTRACTION       OB History    Gravida  1   Para  1   Term  1   Preterm      AB      Living  1     SAB      TAB      Ectopic      Multiple  0   Live Births  1            Home Medications    Prior to Admission medications   Medication Sig Start Date End Date Taking? Authorizing Provider  azithromycin (ZITHROMAX Z-PAK) 250 MG tablet Use as directed 12/27/17   Cheral Marker, CNM  Prenat w/o [id number] [facility]) 35-1 & 300 MG tablet One tablet and one capsule daily 12/27/17   Cheral Marker, CNM    Family History Family History  Problem Relation Age of Onset  . Cancer Mother   . Clotting disorder Mother   . Venous thrombosis Mother   . Hypertension Mother   .  Diabetes Maternal Grandmother   . Hypertension Maternal Grandmother   . Diabetes Maternal Grandfather     Social History Social History   Tobacco Use  . Smoking status: Never Smoker  . Smokeless tobacco: Never Used  Substance Use Topics  . Alcohol use: No  . Drug use: No     Allergies   Penicillins   Review of Systems Review of Systems  Constitutional: Negative for activity change.       All ROS Neg except as noted in HPI  HENT: Negative for nosebleeds.   Eyes: Negative for photophobia and discharge.  Respiratory: Negative for cough, shortness of breath and wheezing.   Cardiovascular: Negative for chest pain and palpitations.  Gastrointestinal: Negative for abdominal pain and blood in stool.  Genitourinary: Negative for dysuria, frequency and hematuria.  Musculoskeletal: Positive for back pain and neck pain. Negative for arthralgias.  Skin: Negative.   Neurological: Negative for dizziness, seizures and speech difficulty.  Psychiatric/Behavioral: Negative for confusion and hallucinations.     Physical Exam Updated Vital Signs BP 128/83 (BP Location: Right Arm)   Pulse 81  Temp 98.7 F (37.1 C) (Oral)   Resp 17   Ht 5\' 1"  (1.549 m)   Wt 86.2 kg (190 lb)   LMP 01/16/2018   SpO2 100%   BMI 35.90 kg/m   Physical Exam  Constitutional: She is oriented to person, place, and time. She appears well-developed and well-nourished.  Non-toxic appearance.  HENT:  Head: Normocephalic.  Right Ear: Tympanic membrane and external ear normal.  Left Ear: Tympanic membrane and external ear normal.  Eyes: Pupils are equal, round, and reactive to light. EOM and lids are normal.  Neck: Normal range of motion. Neck supple. Carotid bruit is not present.  Cardiovascular: Normal rate, regular rhythm, normal heart sounds, intact distal pulses and normal pulses.  Pulmonary/Chest: Breath sounds normal. No respiratory distress.        Abdominal: Soft. Bowel sounds are normal.  There is no tenderness. There is no guarding.  Musculoskeletal: Normal range of motion.       Left shoulder: She exhibits spasm.  There is tightness, tenseness, and spasm involving the upper trapezius extending down the area near the scapula and around to the lateral chest.  Pain is easily reproduced with attempted range of motion.  There is no evidence of any dislocation.  There is no crepitus appreciated with range of motion of the shoulder.  There is full range of motion of the left elbow, wrist, and fingers.  The radial pulses are 2+.  The brachial pulses are 2+.  Capillary refill is less than 2 seconds.  Lymphadenopathy:       Head (right side): No submandibular adenopathy present.       Head (left side): No submandibular adenopathy present.    She has no cervical adenopathy.  Neurological: She is alert and oriented to person, place, and time. She has normal strength. No cranial nerve deficit or sensory deficit.  No motor or sensory deficits appreciated of the upper extremities.  Grip is symmetrical.  Skin: Skin is warm and dry.  Early abscess of the left axilla.  No red streaks appreciated.  No drainage noted.  Psychiatric: She has a normal mood and affect. Her speech is normal.  Nursing note and vitals reviewed.    ED Treatments / Results  Labs (all labs ordered are listed, but only abnormal results are displayed) Labs Reviewed - No data to display  EKG None  Radiology No results found.  Procedures Procedures (including critical care time)  Medications Ordered in ED Medications - No data to display   Initial Impression / Assessment and Plan / ED Course  I have reviewed the triage vital signs and the nursing notes.  Pertinent labs & imaging results that were available during my care of the patient were reviewed by me and considered in my medical decision making (see chart for details).       Final Clinical Impressions(s) / ED Diagnoses  MDM  Vital signs within  normal limits.  There are no gross neurologic or vascular deficits appreciated on examination.  There is noted tightness, tenseness, and spasm involving the upper trapezius and extending around to the lateral left chest wall area.  Also some noted in the anterior left chest wall area.  I suspect that the patient has spasm involving the shoulder and chest, she does a lot of heavy lifting.  The patient also has an early abscess formation of the left axilla.  The area is not hot.  There is no drainage or red streaking appreciated.  The patient  will use warm Epson salt tub soaks to the abscess area daily.  She will be started on doxycycline 2 times daily with food.  The patient will be treated with ibuprofen and Flexeril for the muscle strain areas.  Have asked the patient to rest her shoulder and chest area is much as possible.  The patient will follow up with the primary Medicaid access physician, or return to the emergency department if any changes or problems.  The patient will see Dr. Lovell Sheehan for surgical opinion concerning the abscess area if this continues to be a problem, or return to the emergency department.   Final diagnoses:  Strain of left shoulder, initial encounter  Chest wall pain  Abscess of left axilla    ED Discharge Orders        Ordered    doxycycline (VIBRAMYCIN) 100 MG capsule  2 times daily     01/18/18 1818    traMADol (ULTRAM) 50 MG tablet  Every 6 hours PRN     01/18/18 1818    ibuprofen (ADVIL,MOTRIN) 600 MG tablet  4 times daily     01/18/18 1818    cyclobenzaprine (FLEXERIL) 10 MG tablet  3 times daily     01/18/18 1818       Ivery Quale, PA-C 01/18/18 1827    Doug Sou, MD 01/19/18 0002

## 2018-01-18 NOTE — Discharge Instructions (Addendum)
Your vital signs are within normal limits.  Your oxygen level is 100% on room air.  Your examination shows multiple areas of muscle spasm both in the anterior shoulder and chest area, as well as the posterior shoulder area.  Please rest your shoulder and chest muscles as much as possible.  Use Flexeril 3 times daily.  This medication will cause drowsiness.  Please do not drive a vehicle, drink alcohol, operate machinery, or participate in activities requiring concentration when taking this medication.  Use ibuprofen with breakfast, lunch, dinner, and at bedtime.  Use Ultram every 6 hours if needed for more severe pain.  Your developing an abscess under your left arm.  Please soak in warm Epson salt tub soaks for 15 minutes daily until this has resolved.  Please use doxycycline 2 times daily with food.  Please see Dr. Lovell SheehanJenkins for surgical evaluation or return to the emergency department if this abscess area is not improving.

## 2018-01-18 NOTE — ED Triage Notes (Signed)
Pt c/o back and neck pain/soreness that worsens with ambulation & movement x 4 days. 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

## 2018-01-24 ENCOUNTER — Other Ambulatory Visit: Payer: Medicaid Other | Admitting: Women's Health

## 2018-02-22 ENCOUNTER — Other Ambulatory Visit: Payer: Medicaid Other | Admitting: Women's Health

## 2018-03-22 ENCOUNTER — Other Ambulatory Visit: Payer: Medicaid Other | Admitting: Women's Health

## 2018-03-22 ENCOUNTER — Encounter: Payer: Self-pay | Admitting: *Deleted

## 2018-09-30 ENCOUNTER — Encounter (HOSPITAL_COMMUNITY): Payer: Self-pay | Admitting: Emergency Medicine

## 2018-09-30 ENCOUNTER — Other Ambulatory Visit: Payer: Self-pay

## 2018-09-30 ENCOUNTER — Emergency Department (HOSPITAL_COMMUNITY)
Admission: EM | Admit: 2018-09-30 | Discharge: 2018-10-01 | Disposition: A | Discharge status: Home / Self Care | Payer: Medicaid Other | Attending: Emergency Medicine | Admitting: Emergency Medicine

## 2018-09-30 DIAGNOSIS — Z5321 Procedure and treatment not carried out due to patient leaving prior to being seen by health care provider: Secondary | ICD-10-CM | POA: Insufficient documentation

## 2018-09-30 DIAGNOSIS — M5489 Other dorsalgia: Secondary | ICD-10-CM | POA: Diagnosis not present

## 2018-09-30 LAB — URINALYSIS, ROUTINE W REFLEX MICROSCOPIC
Bilirubin Urine: NEGATIVE
Glucose, UA: NEGATIVE mg/dL
Ketones, ur: NEGATIVE mg/dL
Leukocytes, UA: NEGATIVE
Nitrite: NEGATIVE
PH: 6 (ref 5.0–8.0)
Protein, ur: NEGATIVE mg/dL
RBC / HPF: >50 RBC/hpf — ABNORMAL HIGH (ref 0–5)
SPECIFIC GRAVITY, URINE: 1.023 (ref 1.005–1.030)

## 2018-09-30 LAB — LIPASE, BLOOD: Lipase: 37 U/L (ref 11–51)

## 2018-09-30 LAB — CBC
HCT: 42.2 % (ref 36.0–46.0)
Hemoglobin: 13.2 g/dL (ref 12.0–15.0)
MCH: 28.3 pg (ref 26.0–34.0)
MCHC: 31.3 g/dL (ref 30.0–36.0)
MCV: 90.4 fL (ref 80.0–100.0)
NRBC: 0 % (ref 0.0–0.2)
PLATELETS: 246 10*3/uL (ref 150–400)
RBC: 4.67 MIL/uL (ref 3.87–5.11)
RDW: 12.3 % (ref 11.5–15.5)
WBC: 8.6 10*3/uL (ref 4.0–10.5)

## 2018-09-30 LAB — COMPREHENSIVE METABOLIC PANEL
ALK PHOS: 41 U/L (ref 38–126)
ALT: 14 U/L (ref 0–44)
AST: 19 U/L (ref 15–41)
Albumin: 2.8 g/dL — ABNORMAL LOW (ref 3.5–5.0)
Anion gap: 2 — ABNORMAL LOW (ref 5–15)
BUN: 7 mg/dL (ref 6–20)
CALCIUM: 8.3 mg/dL — AB (ref 8.9–10.3)
CO2: 30 mmol/L (ref 22–32)
CREATININE: 1.01 mg/dL — AB (ref 0.44–1.00)
Chloride: 107 mmol/L (ref 98–111)
GFR calc non Af Amer: >60 mL/min (ref >60)
GLUCOSE: 100 mg/dL — AB (ref 70–99)
Potassium: 3.8 mmol/L (ref 3.5–5.1)
SODIUM: 139 mmol/L (ref 135–145)
Total Bilirubin: <0.1 mg/dL — ABNORMAL LOW (ref 0.3–1.2)
Total Protein: 5.2 g/dL — ABNORMAL LOW (ref 6.5–8.1)

## 2018-09-30 LAB — I-STAT BETA HCG BLOOD, ED (MC, WL, AP ONLY)
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
## 2018-11-27 ENCOUNTER — Emergency Department (HOSPITAL_COMMUNITY)
Admission: EM | Admit: 2018-11-27 | Discharge: 2018-11-27 | Discharge status: Absent without leave | Payer: Medicaid Other

## 2018-11-28 ENCOUNTER — Encounter (HOSPITAL_COMMUNITY): Payer: Self-pay

## 2018-11-28 ENCOUNTER — Other Ambulatory Visit: Payer: Self-pay

## 2018-11-28 ENCOUNTER — Emergency Department (HOSPITAL_COMMUNITY): Payer: Medicaid Other

## 2018-11-28 ENCOUNTER — Emergency Department (HOSPITAL_COMMUNITY)
Admission: EM | Admit: 2018-11-28 | Discharge: 2018-11-28 | Disposition: A | Discharge status: Home / Self Care | Payer: Medicaid Other | Attending: Emergency Medicine | Admitting: Emergency Medicine

## 2018-11-28 DIAGNOSIS — F17210 Nicotine dependence, cigarettes, uncomplicated: Secondary | ICD-10-CM | POA: Insufficient documentation

## 2018-11-28 DIAGNOSIS — N83201 Unspecified ovarian cyst, right side: Secondary | ICD-10-CM | POA: Insufficient documentation

## 2018-11-28 DIAGNOSIS — R1030 Lower abdominal pain, unspecified: Secondary | ICD-10-CM | POA: Diagnosis present

## 2018-11-28 DIAGNOSIS — E876 Hypokalemia: Secondary | ICD-10-CM

## 2018-11-28 DIAGNOSIS — I10 Essential (primary) hypertension: Secondary | ICD-10-CM | POA: Insufficient documentation

## 2018-11-28 DIAGNOSIS — Z79899 Other long term (current) drug therapy: Secondary | ICD-10-CM | POA: Insufficient documentation

## 2018-11-28 LAB — COMPREHENSIVE METABOLIC PANEL
ALT: 15 U/L (ref 0–44)
AST: 26 U/L (ref 15–41)
Albumin: 3.4 g/dL — ABNORMAL LOW (ref 3.5–5.0)
Alkaline Phosphatase: 55 U/L (ref 38–126)
Anion gap: 11 (ref 5–15)
BUN: 8 mg/dL (ref 6–20)
CO2: 19 mmol/L — ABNORMAL LOW (ref 22–32)
Calcium: 8.9 mg/dL (ref 8.9–10.3)
Chloride: 107 mmol/L (ref 98–111)
Creatinine, Ser: 0.99 mg/dL (ref 0.44–1.00)
GFR calc Af Amer: >60 mL/min (ref >60)
GFR calc non Af Amer: >60 mL/min (ref >60)
Glucose, Bld: 111 mg/dL — ABNORMAL HIGH (ref 70–99)
Potassium: 3 mmol/L — ABNORMAL LOW (ref 3.5–5.1)
Sodium: 137 mmol/L (ref 135–145)
Total Bilirubin: 0.6 mg/dL (ref 0.3–1.2)
Total Protein: 6.1 g/dL — ABNORMAL LOW (ref 6.5–8.1)

## 2018-11-28 LAB — URINALYSIS, ROUTINE W REFLEX MICROSCOPIC
Bilirubin Urine: NEGATIVE
Glucose, UA: NEGATIVE mg/dL
Ketones, ur: NEGATIVE mg/dL
Leukocytes,Ua: NEGATIVE
Nitrite: NEGATIVE
Protein, ur: NEGATIVE mg/dL
SPECIFIC GRAVITY, URINE: 1.017 (ref 1.005–1.030)
pH: 6 (ref 5.0–8.0)

## 2018-11-28 LAB — LIPASE, BLOOD: Lipase: 32 U/L (ref 11–51)

## 2018-11-28 LAB — WET PREP, GENITAL
Sperm: NONE SEEN
Trich, Wet Prep: NONE SEEN
Yeast Wet Prep HPF POC: NONE SEEN

## 2018-11-28 LAB — I-STAT BETA HCG BLOOD, ED (MC, WL, AP ONLY): I-stat hCG, quantitative: <5 m[IU]/mL (ref <5)

## 2018-11-28 LAB — CBC
HCT: 43.2 % (ref 36.0–46.0)
Hemoglobin: 13.8 g/dL (ref 12.0–15.0)
MCH: 28.1 pg (ref 26.0–34.0)
MCHC: 31.9 g/dL (ref 30.0–36.0)
MCV: 88 fL (ref 80.0–100.0)
Platelets: 288 10*3/uL (ref 150–400)
RBC: 4.91 MIL/uL (ref 3.87–5.11)
RDW: 12.1 % (ref 11.5–15.5)
WBC: 6.9 10*3/uL (ref 4.0–10.5)
nRBC: 0 % (ref 0.0–0.2)

## 2018-11-28 MED ORDER — POTASSIUM CHLORIDE CRYS ER 20 MEQ PO TBCR
40.0000 meq | EXTENDED_RELEASE_TABLET | Freq: Once | ORAL | Status: AC
  Administered 2018-11-28: 40 meq via ORAL
  Filled 2018-11-28: qty 2

## 2018-11-28 MED ORDER — POTASSIUM CHLORIDE CRYS ER 20 MEQ PO TBCR: 20.0000 meq | EXTENDED_RELEASE_TABLET | Freq: Every day | ORAL | 0 refills | Status: DC

## 2018-11-28 MED ORDER — HYDROMORPHONE HCL 1 MG/ML IJ SOLN
1.0000 mg | Freq: Once | INTRAMUSCULAR | Status: AC
  Administered 2018-11-28: 1 mg via INTRAVENOUS
  Filled 2018-11-28: qty 1

## 2018-11-28 MED ORDER — NAPROXEN 500 MG PO TABS: 500.0000 mg | ORAL_TABLET | Freq: Two times a day (BID) | ORAL | 0 refills | Status: DC

## 2018-11-28 MED ORDER — NAPROXEN 250 MG PO TABS
500.0000 mg | ORAL_TABLET | Freq: Once | ORAL | Status: AC
  Administered 2018-11-28: 500 mg via ORAL
  Filled 2018-11-28: qty 2

## 2018-11-28 MED ORDER — ONDANSETRON HCL 4 MG/2ML IJ SOLN
4.0000 mg | Freq: Once | INTRAMUSCULAR | Status: AC
  Administered 2018-11-28: 4 mg via INTRAVENOUS
  Filled 2018-11-28: qty 2

## 2018-11-28 MED ORDER — ONDANSETRON 4 MG PO TBDP
4.0000 mg | ORAL_TABLET | Freq: Once | ORAL | Status: AC
  Administered 2018-11-28: 4 mg via ORAL
  Filled 2018-11-28: qty 1

## 2018-11-28 NOTE — ED Notes (Signed)
Patient transported to [facility] 

## 2018-11-28 NOTE — ED Triage Notes (Signed)
Pt arrives with abdominal pain since 0700, started as cramps and quickly developed into pain she compares to delivery pain, Lcenter of abdomen

## 2018-11-28 NOTE — ED Notes (Signed)
Pt verbalized understanding of discharge instructions and denies any further questions at this time.   

## 2018-11-28 NOTE — ED Provider Notes (Cosign Needed)
[facility] EMERGENCY DEPARTMENT Provider Note   CSN: [id number] Arrival date & time: 11/28/18  1319    History   Chief Complaint Chief Complaint  Patient presents with  . Abdominal Pain    HPI Christina Ingram is a 23 y.o. female with past medical history of hypertension, presenting to the emergency department with complaint of gradual onset of lower abdominal pain that began this morning.  Patient states pain was initially mild and cramping like a menstrual period, however it gradually worsened.  Waves of pain feel like labor pains.  Feels all right between the episodes of pain.  She endorses vaginal bleeding, not think this is a menstrual period because she had more bleeding in the beginning than she usually does with menstrual periods.  This began at the end of February.  No abnormal vaginal discharge.  Does endorse associated nausea without vomiting and diarrhea.  She also has some increase in urinary frequency.  No associated fevers.  No known history of nephrolithiasis.     The history is provided by the patient.    Past Medical History:  Diagnosis Date  . Hypertension     Patient Active Problem List   Diagnosis Date Noted  . Nexplanon insertion 06/18/2015  . 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 IUGR (intrauterine growth restriction) 04/21/2015    Past Surgical History:  Procedure Laterality Date  . WISDOM TOOTH EXTRACTION       OB History    Gravida  1   Para  1   Term  1   Preterm      AB      Living  1     SAB      TAB      Ectopic      Multiple  0   Live Births  1            Home Medications    Prior to Admission medications   Medication Sig Start Date End Date Taking? Authorizing Provider  azithromycin (ZITHROMAX Z-PAK) 250 MG tablet Use as directed 12/27/17   Cheral Marker, CNM  cyclobenzaprine (FLEXERIL) 10 MG tablet Take 1 tablet (10 mg total) by mouth 3 (three) times daily. 01/18/18   Ivery Quale, PA-C    doxycycline (VIBRAMYCIN) 100 MG capsule Take 1 capsule (100 mg total) by mouth 2 (two) times daily. 01/18/18   Ivery Quale, PA-C  ibuprofen (ADVIL,MOTRIN) 600 MG tablet Take 1 tablet (600 mg total) by mouth 4 (four) times daily. 01/18/18   Ivery Quale, PA-C  naproxen (NAPROSYN) 500 MG tablet Take 1 tablet (500 mg total) by mouth 2 (two) times daily. 11/28/18   , [place] N, PA-C  Prenat w/o [id number] [facility] ASSURE) 35-1 & 300 MG tablet One tablet and one capsule daily 12/27/17   Cheral Marker, CNM  traMADol (ULTRAM) 50 MG tablet Take 1 tablet (50 mg total) by mouth every 6 (six) hours as needed. 01/18/18   Ivery Quale, PA-C    Family History Family History  Problem Relation Age of Onset  . Cancer Mother   . Clotting disorder Mother   . Venous thrombosis Mother   . Hypertension Mother   . Diabetes Maternal Grandmother   . Hypertension Maternal Grandmother   . Diabetes Maternal Grandfather     Social History Social History   Tobacco Use  . Smoking status: Current Every Day Smoker    Packs/day: 0.25    Types: Cigarettes  . Smokeless tobacco: Never Used  Substance Use Topics  . Alcohol use: No  . Drug use: No     Allergies   Penicillins   Review of Systems Review of Systems  Constitutional: Negative for fever.  Gastrointestinal: Positive for abdominal pain and nausea. Negative for vomiting.  Genitourinary: Positive for frequency and vaginal bleeding. Negative for dysuria and vaginal discharge.  All other systems reviewed and are negative.    Physical Exam Updated Vital Signs BP 110/77 (BP Location: Right Arm)   Pulse 82   Temp 98.2 F (36.8 C) (Oral)   Resp 18   Ht  (1.549 m)   Wt 81.6 kg   SpO2 100%   BMI 34.01 kg/m   Physical Exam Vitals signs and nursing note reviewed. Exam conducted with a chaperone present.  Constitutional:      Appearance: She is well-developed.     Comments: Patient intermittently moaning in pain.   HENT:     Head: Normocephalic and atraumatic.     Mouth/Throat:     Mouth: Mucous membranes are moist.  Eyes:     Conjunctiva/sclera: Conjunctivae normal.  Cardiovascular:     Rate and Rhythm: Normal rate and regular rhythm.  Pulmonary:     Effort: Pulmonary effort is normal. No respiratory distress.     Breath sounds: Normal breath sounds.  Abdominal:     General: Bowel sounds are normal.     Palpations: Abdomen is soft.     Tenderness: There is abdominal tenderness in the suprapubic area and left lower quadrant. There is no guarding or rebound.  Genitourinary:    Labia:        Right: No rash or tenderness.        Left: No rash or tenderness.      Vagina: No tenderness.     Cervix: Discharge (Dark red bloody discharge) present. No cervical motion tenderness.     Uterus: Normal. Not enlarged and not tender.      Adnexa: Right adnexa normal and left adnexa normal.       Right: No mass or tenderness.         Left: No mass or tenderness.    Skin:    General: Skin is warm.  Neurological:     Mental Status: She is alert.  Psychiatric:        Behavior: Behavior normal.      ED Treatments / Results  Labs (all labs ordered are listed, but only abnormal results are displayed) Labs Reviewed  WET PREP, GENITAL - Abnormal; Notable for the following components:      Result Value   Clue Cells Wet Prep HPF POC PRESENT (*)    WBC, Wet Prep HPF POC MODERATE (*)    All other components within normal limits  COMPREHENSIVE METABOLIC PANEL - Abnormal; Notable for the following components:   Potassium 3.0 (*)    CO2 19 (*)    Glucose, Bld 111 (*)    Total Protein 6.1 (*)    Albumin 3.4 (*)    All other components within normal limits  LIPASE, BLOOD  CBC  URINALYSIS, ROUTINE W REFLEX MICROSCOPIC  I-STAT BETA HCG BLOOD, ED ([facility], [facility], [facility] ONLY)  GC/CHLAMYDIA PROBE AMP ([place]) NOT AT [facility]    EKG None  Radiology US Transvaginal Non-ob  Result Date: 11/28/2018 CLINICAL DATA:   Abdominal pain since 0800 hours EXAM: TRANSABDOMINAL AND TRANSVAGINAL ULTRASOUND OF PELVIS DOPPLER ULTRASOUND OF OVARIES TECHNIQUE: Both transabdominal and transvaginal ultrasound examinations of the pelvis were  performed. Transabdominal technique was performed for global imaging of the pelvis including uterus, ovaries, adnexal regions, and pelvic cul-de-sac. It was necessary to proceed with endovaginal exam following the transabdominal exam to visualize the uterus, endometrium and ovaries. Color and duplex Doppler ultrasound was utilized to evaluate blood flow to the ovaries. COMPARISON:  03/17/2017 FINDINGS: Uterus Measurements: 7.5 x 4.1 x 5.1 cm = volume: 82 mL. Normal morphology without mass Endometrium Thickness: 5 mm, normal.  No endometrial fluid or focal abnormality Right ovary Measurements: 5.1 x 2.5 x 2.2 cm = volume: 14.6 mL. Complex hypoechoic region within RIGHT ovary 20 x 14 x 8 mm, question small hemorrhagic cyst versus resolving corpus luteum. Blood flow present within RIGHT ovary on color Doppler imaging. Left ovary Measurements: 3.7 x 2.0 x 2.0 cm = volume: 7.5 mL. Normal morphology without mass. Internal blood flow present on color Doppler imaging. Pulsed Doppler evaluation of both ovaries demonstrates normal low-resistance arterial and venous waveforms. Other findings Trace free pelvic fluid, potentially physiologic. No adnexal masses. IMPRESSION: Question small hemorrhagic cyst versus resolving corpus luteum within RIGHT ovary. Otherwise normal exam. No evidence of ovarian torsion. Electronically Signed   By: Ulyses Southward M.D.   On: 11/28/2018 15:38   US Pelvis Complete  Result Date: 11/28/2018 CLINICAL DATA:  Abdominal pain since 0800 hours EXAM: TRANSABDOMINAL AND TRANSVAGINAL ULTRASOUND OF PELVIS DOPPLER ULTRASOUND OF OVARIES TECHNIQUE: Both transabdominal and transvaginal ultrasound examinations of the pelvis were performed. Transabdominal technique was performed for global imaging of the  pelvis including uterus, ovaries, adnexal regions, and pelvic cul-de-sac. It was necessary to proceed with endovaginal exam following the transabdominal exam to visualize the uterus, endometrium and ovaries. Color and duplex Doppler ultrasound was utilized to evaluate blood flow to the ovaries. COMPARISON:  03/17/2017 FINDINGS: Uterus Measurements: 7.5 x 4.1 x 5.1 cm = volume: 82 mL. Normal morphology without mass Endometrium Thickness: 5 mm, normal.  No endometrial fluid or focal abnormality Right ovary Measurements: 5.1 x 2.5 x 2.2 cm = volume: 14.6 mL. Complex hypoechoic region within RIGHT ovary 20 x 14 x 8 mm, question small hemorrhagic cyst versus resolving corpus luteum. Blood flow present within RIGHT ovary on color Doppler imaging. Left ovary Measurements: 3.7 x 2.0 x 2.0 cm = volume: 7.5 mL. Normal morphology without mass. Internal blood flow present on color Doppler imaging. Pulsed Doppler evaluation of both ovaries demonstrates normal low-resistance arterial and venous waveforms. Other findings Trace free pelvic fluid, potentially physiologic. No adnexal masses. IMPRESSION: Question small hemorrhagic cyst versus resolving corpus luteum within RIGHT ovary. Otherwise normal exam. No evidence of ovarian torsion. Electronically Signed   By: Ulyses Southward M.D.   On: 11/28/2018 15:38   [place] Art/ven Flow Abd Pelv Doppler  Result Date: 11/28/2018 CLINICAL DATA:  Abdominal pain since 0800 hours EXAM: TRANSABDOMINAL AND TRANSVAGINAL ULTRASOUND OF PELVIS DOPPLER ULTRASOUND OF OVARIES TECHNIQUE: Both transabdominal and transvaginal ultrasound examinations of the pelvis were performed. Transabdominal technique was performed for global imaging of the pelvis including uterus, ovaries, adnexal regions, and pelvic cul-de-sac. It was necessary to proceed with endovaginal exam following the transabdominal exam to visualize the uterus, endometrium and ovaries. Color and duplex Doppler ultrasound was utilized to evaluate  blood flow to the ovaries. COMPARISON:  03/17/2017 FINDINGS: Uterus Measurements: 7.5 x 4.1 x 5.1 cm = volume: 82 mL. Normal morphology without mass Endometrium Thickness: 5 mm, normal.  No endometrial fluid or focal abnormality Right ovary Measurements: 5.1 x 2.5 x 2.2 cm = volume: 14.6 mL.  Complex hypoechoic region within RIGHT ovary 20 x 14 x 8 mm, question small hemorrhagic cyst versus resolving corpus luteum. Blood flow present within RIGHT ovary on color Doppler imaging. Left ovary Measurements: 3.7 x 2.0 x 2.0 cm = volume: 7.5 mL. Normal morphology without mass. Internal blood flow present on color Doppler imaging. Pulsed Doppler evaluation of both ovaries demonstrates normal low-resistance arterial and venous waveforms. Other findings Trace free pelvic fluid, potentially physiologic. No adnexal masses. IMPRESSION: Question small hemorrhagic cyst versus resolving corpus luteum within RIGHT ovary. Otherwise normal exam. No evidence of ovarian torsion. Electronically Signed   By: Ulyses Southward M.D.   On: 11/28/2018 15:38    Procedures Procedures (including critical care time)  Medications Ordered in ED Medications  potassium chloride SA (K-DUR,KLOR-CON) CR tablet 40 mEq (has no administration in time range)  HYDROmorphone (DILAUDID) injection 1 mg (1 mg Intravenous Given 11/28/18 1401)  ondansetron (ZOFRAN) injection 4 mg (4 mg Intravenous Given 11/28/18 1349)     Initial Impression / Assessment and Plan / ED Course  I have reviewed the triage vital signs and the nursing notes.  Pertinent labs & imaging results that were available during my care of the patient were reviewed by me and considered in my medical decision making (see chart for details).        Patient presenting with worsening lower abdominal pain since this morning with associated nausea and vaginal bleeding, however question if it is her menstrual cycle.  Does endorse an increased urinary frequency as well.  On exam, lower  abdominal tenderness present, no guarding or rebound.  Pelvic exam with dark red blood from the cervix, however no CMT or adnexal tenderness.  Patient states this feels similar to history of ovarian cyst.  Pelvic ultrasound consistent with likely hemorrhagic cyst.  No torsion.  Pain significantly improved with intervention.  Labs are very reassuring, no leukocytosis.  Mild hypokalemia, replaced in the ED.  Negative hCG.  Patient pending urine sample.  Care handed off at shift change to PA Harris, pending UA results.  Anticipated discharge with symptomatic management and GYN follow-up.  Treat UTI if present.  Discussed results, findings, treatment and follow up. Patient advised of return precautions. Patient verbalized understanding and agreed with plan.   Final Clinical Impressions(s) / ED Diagnoses   Final diagnoses:  Right ovarian cyst    ED Discharge Orders         Ordered    naproxen (NAPROSYN) 500 MG tablet  2 times daily     11/28/18 1627           , [place] N, [place] 11/28/18 1628

## 2018-11-28 NOTE — ED Notes (Signed)
RN sent 1 visitor to pt's Rm

## 2018-11-28 NOTE — Discharge Instructions (Addendum)
Schedule appointment with a gynecologist to follow-up on your visit today. Have your urine checked if you continue to have frequency. You can take naproxen every 12 hours with meals as needed for abdominal pain.  Return if you have severe back pain, fever, intractable vomiting or vaginal bleeding where you are soaking through >1 pad/hour

## 2018-11-28 NOTE — ED Provider Notes (Signed)
4:18 PM  BP 110/77 (BP Location: Right Arm)   Pulse 82   Temp 98.2 F (36.8 C) (Oral)   Resp 18   Ht 5\' 1"  (1.549 m)   Wt 81.6 kg   SpO2 100%   BMI 34.01 kg/m  Patient taken in sign out from [facility].  Awaiting [place] Here for hemorrhagic ovarian cyst.   Clinical Course as of Nov 28 16  Mon Nov 28, 2018  9604 Patient states that she no longer wants to be here and is ready to go home. She may have her urine checked by her pcp if she continues to have frequency.    [AH]    Clinical Course User Index [AH] Elinor Parkinson, PA-C 11/29/18 0021    Terrilee Files, MD 11/29/18 [phone]

## 2018-11-28 NOTE — ED Notes (Signed)
Pt crying hysterically and holding her abd

## 2018-12-01 ENCOUNTER — Other Ambulatory Visit: Payer: Self-pay

## 2018-12-01 ENCOUNTER — Emergency Department (HOSPITAL_COMMUNITY)
Admission: EM | Admit: 2018-12-01 | Discharge: 2018-12-01 | Disposition: A | Discharge status: Home / Self Care | Payer: Medicaid Other | Attending: Emergency Medicine | Admitting: Emergency Medicine

## 2018-12-01 DIAGNOSIS — N760 Acute vaginitis: Secondary | ICD-10-CM | POA: Diagnosis not present

## 2018-12-01 DIAGNOSIS — I10 Essential (primary) hypertension: Secondary | ICD-10-CM | POA: Insufficient documentation

## 2018-12-01 DIAGNOSIS — F17210 Nicotine dependence, cigarettes, uncomplicated: Secondary | ICD-10-CM | POA: Diagnosis not present

## 2018-12-01 DIAGNOSIS — R109 Unspecified abdominal pain: Secondary | ICD-10-CM

## 2018-12-01 DIAGNOSIS — B9689 Other specified bacterial agents as the cause of diseases classified elsewhere: Secondary | ICD-10-CM

## 2018-12-01 DIAGNOSIS — R1030 Lower abdominal pain, unspecified: Secondary | ICD-10-CM | POA: Diagnosis present

## 2018-12-01 LAB — I-STAT BETA HCG BLOOD, ED (MC, WL, AP ONLY): I-stat hCG, quantitative: <5 m[IU]/mL (ref <5)

## 2018-12-01 LAB — CBC WITH DIFFERENTIAL/PLATELET
Abs Immature Granulocytes: 0.03 10*3/uL (ref 0.00–0.07)
Basophils Absolute: 0 10*3/uL (ref 0.0–0.1)
Basophils Relative: 0 %
Eosinophils Absolute: 0.2 10*3/uL (ref 0.0–0.5)
Eosinophils Relative: 3 %
HCT: 43.1 % (ref 36.0–46.0)
Hemoglobin: 13.4 g/dL (ref 12.0–15.0)
Immature Granulocytes: 0 %
LYMPHS ABS: 2.5 10*3/uL (ref 0.7–4.0)
Lymphocytes Relative: 33 %
MCH: 27.9 pg (ref 26.0–34.0)
MCHC: 31.1 g/dL (ref 30.0–36.0)
MCV: 89.8 fL (ref 80.0–100.0)
Monocytes Absolute: 0.5 10*3/uL (ref 0.1–1.0)
Monocytes Relative: 7 %
Neutro Abs: 4.4 10*3/uL (ref 1.7–7.7)
Neutrophils Relative %: 57 %
Platelets: 291 10*3/uL (ref 150–400)
RBC: 4.8 MIL/uL (ref 3.87–5.11)
RDW: 12 % (ref 11.5–15.5)
WBC: 7.6 10*3/uL (ref 4.0–10.5)
nRBC: 0 % (ref 0.0–0.2)

## 2018-12-01 LAB — COMPREHENSIVE METABOLIC PANEL
ALK PHOS: 47 U/L (ref 38–126)
ALT: 15 U/L (ref 0–44)
AST: 20 U/L (ref 15–41)
Albumin: 3.2 g/dL — ABNORMAL LOW (ref 3.5–5.0)
Anion gap: 6 (ref 5–15)
BILIRUBIN TOTAL: 0.1 mg/dL — AB (ref 0.3–1.2)
BUN: 8 mg/dL (ref 6–20)
CALCIUM: 9 mg/dL (ref 8.9–10.3)
CO2: 23 mmol/L (ref 22–32)
Chloride: 110 mmol/L (ref 98–111)
Creatinine, Ser: 0.92 mg/dL (ref 0.44–1.00)
GFR calc Af Amer: >60 mL/min (ref >60)
GFR calc non Af Amer: >60 mL/min (ref >60)
Glucose, Bld: 92 mg/dL (ref 70–99)
Potassium: 3.9 mmol/L (ref 3.5–5.1)
Sodium: 139 mmol/L (ref 135–145)
TOTAL PROTEIN: 5.8 g/dL — AB (ref 6.5–8.1)

## 2018-12-01 LAB — URINALYSIS, ROUTINE W REFLEX MICROSCOPIC
Bilirubin Urine: NEGATIVE
Glucose, UA: NEGATIVE mg/dL
HGB URINE DIPSTICK: NEGATIVE
Ketones, ur: NEGATIVE mg/dL
Leukocytes,Ua: NEGATIVE
Nitrite: NEGATIVE
Protein, ur: NEGATIVE mg/dL
Specific Gravity, Urine: 1.019 (ref 1.005–1.030)
pH: 5 (ref 5.0–8.0)

## 2018-12-01 LAB — GC/CHLAMYDIA PROBE AMP (~~LOC~~) NOT AT ARMC
Chlamydia: NEGATIVE
Neisseria Gonorrhea: NEGATIVE

## 2018-12-01 LAB — WET PREP, GENITAL
Sperm: NONE SEEN
Trich, Wet Prep: NONE SEEN
Yeast Wet Prep HPF POC: NONE SEEN

## 2018-12-01 MED ORDER — METRONIDAZOLE 500 MG PO TABS: 500.0000 mg | ORAL_TABLET | Freq: Two times a day (BID) | ORAL | 0 refills | Status: AC

## 2018-12-01 MED ORDER — KETOROLAC TROMETHAMINE 15 MG/ML IJ SOLN
15.0000 mg | Freq: Once | INTRAMUSCULAR | Status: AC
  Administered 2018-12-01: 15 mg via INTRAVENOUS
  Filled 2018-12-01: qty 1

## 2018-12-01 MED ORDER — ONDANSETRON HCL 4 MG/2ML IJ SOLN
4.0000 mg | Freq: Once | INTRAMUSCULAR | Status: AC
  Administered 2018-12-01: 4 mg via INTRAVENOUS
  Filled 2018-12-01: qty 2

## 2018-12-01 NOTE — Discharge Instructions (Addendum)
You were given a prescription for antibiotics to treat bacterial vaginosis. Please take the antibiotic prescription fully.  Do not drink alcohol while taking this medication.  You have been tested for HIV, syphilis, chlamydia and gonorrhea.  These results will be available in approximately 3 days and you will be contacted by the hospital if the results are positive. Avoid sexual contact until you are aware of the results, and please inform all sexual partners if you test positive for any of these diseases.  Please follow-up with your primary care doctor and your OB/GYN in regards to your symptoms today.  Please return to the ER sooner if you have any new or worsening symptoms, or if you have any of the following symptoms:  Abdominal pain that does not go away.  You have a fever.  You keep throwing up (vomiting).  The pain is felt only in portions of the abdomen. Pain in the right side could possibly be appendicitis. In an adult, pain in the left lower portion of the abdomen could be colitis or diverticulitis.  You pass bloody or black tarry stools.  There is bright red blood in the stool.  The constipation stays for more than 4 days.  There is belly (abdominal) or rectal pain.  You do not seem to be getting better.  You have any questions or concerns.

## 2018-12-01 NOTE — ED Notes (Signed)
Pt states that pain is 8/10, pt laughing with visitor.

## 2018-12-01 NOTE — ED Provider Notes (Signed)
[facility] EMERGENCY DEPARTMENT Provider Note   CSN: [id number] Arrival date & time: 12/01/18  3159    History   Chief Complaint Chief Complaint  Patient presents with  . Abdominal Pain    HPI Christina Ingram is a 23 y.o. female.     HPI   Pt is a 23 y/o female with a h/o HTN who presents to the ED today c/o waxing and waning cramping abd pain. Pain is located to the lower abdomen and periumbilical area. Pain started this morning around 8:30am. Rates pain 10/10. She tried no intervention for her symptoms. States she was seen here a few days ago an diagnosed with a possible ovarian cyst. She states she was given rx for ibuprofen one time and it made all of her pain worse. She reports nausea and diarrhea, but denies vomiting. Denies fevers or dysuria complaints. She denies vaginal bleeding. Denies vaginal discharge. States she is monogamous and has had unprotected intercourse since she was d/c'ed last week. She is requesting STD testing.   Past Medical History:  Diagnosis Date  . Hypertension     Patient Active Problem List   Diagnosis Date Noted  . Nexplanon insertion 06/18/2015  . 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 IUGR (intrauterine growth restriction) 04/21/2015    Past Surgical History:  Procedure Laterality Date  . WISDOM TOOTH EXTRACTION       OB History    Gravida  1   Para  1   Term  1   Preterm      AB      Living  1     SAB      TAB      Ectopic      Multiple  0   Live Births  1            Home Medications    Prior to Admission medications   Medication Sig Start Date End Date Taking? Authorizing Provider  azithromycin (ZITHROMAX Z-PAK) 250 MG tablet Use as directed Patient not taking: Reported on 12/01/2018 12/27/17   Cheral Marker, CNM  cyclobenzaprine (FLEXERIL) 10 MG tablet Take 1 tablet (10 mg total) by mouth 3 (three) times daily. Patient not taking: Reported on 12/01/2018 01/18/18   Ivery Quale, PA-C    doxycycline (VIBRAMYCIN) 100 MG capsule Take 1 capsule (100 mg total) by mouth 2 (two) times daily. Patient not taking: Reported on 12/01/2018 01/18/18   Ivery Quale, PA-C  ibuprofen (ADVIL,MOTRIN) 600 MG tablet Take 1 tablet (600 mg total) by mouth 4 (four) times daily. Patient not taking: Reported on 12/01/2018 01/18/18   Ivery Quale, PA-C  metroNIDAZOLE (FLAGYL) 500 MG tablet Take 1 tablet (500 mg total) by mouth 2 (two) times daily for 7 days. 12/01/18 12/08/18  Shenise Wolgamott S, PA-C  naproxen (NAPROSYN) 500 MG tablet Take 1 tablet (500 mg total) by mouth 2 (two) times daily. Patient not taking: Reported on 12/01/2018 11/28/18   Robinson, [place] N, PA-C  potassium chloride SA (K-DUR,KLOR-CON) 20 MEQ tablet Take 1 tablet (20 mEq total) by mouth daily. Patient not taking: Reported on 12/01/2018 11/28/18   Arthor Captain, PA-C  Prenat w/o A-FeCbGl-DSS-FA-DHA [facility] ASSURE) 35-1 & 300 MG tablet One tablet and one capsule daily Patient not taking: Reported on 12/01/2018 12/27/17   Cheral Marker, CNM  traMADol (ULTRAM) 50 MG tablet Take 1 tablet (50 mg total) by mouth every 6 (six) hours as needed. Patient not taking: Reported on 12/01/2018 01/18/18  Ivery Quale, PA-C    Family History Family History  Problem Relation Age of Onset  . Cancer Mother   . Clotting disorder Mother   . Venous thrombosis Mother   . Hypertension Mother   . Diabetes Maternal Grandmother   . Hypertension Maternal Grandmother   . Diabetes Maternal Grandfather     Social History Social History   Tobacco Use  . Smoking status: Current Every Day Smoker    Packs/day: 0.25    Types: Cigarettes  . Smokeless tobacco: Never Used  Substance Use Topics  . Alcohol use: No  . Drug use: No     Allergies   Penicillins   Review of Systems Review of Systems  Constitutional: Negative for fever.  HENT: Negative for ear pain and sore throat.   Eyes: Negative for visual disturbance.  Respiratory: Negative for cough  and shortness of breath.   Cardiovascular: Negative for chest pain.  Gastrointestinal: Positive for abdominal pain, diarrhea and nausea. Negative for constipation and vomiting.  Genitourinary: Positive for frequency. Negative for dysuria, hematuria, vaginal bleeding and vaginal discharge.  Musculoskeletal: Negative for back pain.  Skin: Negative for color change and rash.  Neurological: Negative for seizures and syncope.  All other systems reviewed and are negative.  Physical Exam Updated Vital Signs BP 101/63 (BP Location: Right Arm)   Pulse 72   Temp 98.4 F (36.9 C) (Oral)   Resp 20   Ht 5\' 1"  (1.549 m)   Wt 81 kg   LMP 11/30/2018   SpO2 100%   BMI 33.74 kg/m   Physical Exam Vitals signs and nursing note reviewed.  Constitutional:      General: She is not in acute distress.    Appearance: She is well-developed.  HENT:     Head: Normocephalic and atraumatic.  Eyes:     Conjunctiva/sclera: Conjunctivae normal.  Neck:     Musculoskeletal: Neck supple.  Cardiovascular:     Rate and Rhythm: Normal rate and regular rhythm.     Heart sounds: No murmur.  Pulmonary:     Effort: Pulmonary effort is normal. No respiratory distress.     Breath sounds: Normal breath sounds.  Abdominal:     Palpations: Abdomen is soft.     Tenderness: There is abdominal tenderness in the periumbilical area and suprapubic area. There is no right CVA tenderness, left CVA tenderness or rebound.  Genitourinary:    Comments: Exam performed by Karrie Meres,  exam chaperoned Date: 12/01/2018 Pelvic exam: normal external genitalia without evidence of trauma. VULVA: normal appearing vulva with no masses, tenderness or lesion. VAGINA: normal appearing vagina with normal color and discharge, no lesions. CERVIX: normal appearing cervix without lesions, cervical motion tenderness absent, cervical os closed with out purulent discharge; vaginal discharge - none, Wet prep and DNA probe for chlamydia and GC  obtained. ADNEXA: normal adnexa in size, nontender and no masses UTERUS: uterus is normal size, shape, consistency and nontender.  Skin:    General: Skin is warm and dry.  Neurological:     Mental Status: She is alert.     ED Treatments / Results  Labs (all labs ordered are listed, but only abnormal results are displayed) Labs Reviewed  WET PREP, GENITAL - Abnormal; Notable for the following components:      Result Value   Clue Cells Wet Prep HPF POC PRESENT (*)    WBC, Wet Prep HPF POC MODERATE (*)    All other components within normal limits  COMPREHENSIVE METABOLIC PANEL - Abnormal; Notable for the following components:   Total Protein 5.8 (*)    Albumin 3.2 (*)    Total Bilirubin 0.1 (*)    All other components within normal limits  URINALYSIS, ROUTINE W REFLEX MICROSCOPIC - Abnormal; Notable for the following components:   APPearance HAZY (*)    All other components within normal limits  CBC WITH DIFFERENTIAL/PLATELET  I-STAT BETA HCG BLOOD, ED ([facility], [facility], [facility] ONLY)  GC/CHLAMYDIA PROBE AMP ([place]) NOT AT [facility]    EKG None  Radiology No results found.  Procedures Procedures (including critical care time)  Medications Ordered in ED Medications  ketorolac (TORADOL) 15 MG/ML injection 15 mg (15 mg Intravenous Given 12/01/18 1042)  ondansetron (ZOFRAN) injection 4 mg (4 mg Intravenous Given 12/01/18 1042)     Initial Impression / Assessment and Plan / ED Course  I have reviewed the triage vital signs and the nursing notes.  Pertinent labs & imaging results that were available during my care of the patient were reviewed by me and considered in my medical decision making (see chart for details).     Final Clinical Impressions(s) / ED Diagnoses   Final diagnoses:  BV (bacterial vaginosis)  Abdominal pain, unspecified abdominal location   Pt presenting with abd pain, nausea, diarrhea. Recently diagnosed with possible ovarian cyst. Has tried no interventions prior  to being seen today. No fevers. Vitals are normal.   Has lower abd and periumbilical tenderness on exam. Pelvic exam with no cervical motion, uterine or adnexal tenderness.  No abnormal discharge on exam. STD testing repeated per request of patient.   Labs are reassuring.  UA does not show evidence of urinary tract infection.  Wet prep suggest BV.  GC chlamydia obtained.  Do not suspect PID based on reassuring pelvic exam.  11:53 PM on reevaluation patient states her pain is currently 0 and she feels much better.  Pelvic exam was completed and patient was in no distress.   Given patient's complete resolution of pain with administration of Toradol, feel that she is safe for discharge home with symptomatic management.  She is been tolerating p.o. in the ED without difficulty and has had no episodes of vomiting.  I will give her follow-up with OB/GYN and with PCP.  Advised to return the ER for new or worsening symptoms and to follow-up as an outpatient in the next 5 to 7 days.  She voiced understanding the plan and reasons to return.  All questions answered.  Patient stable at discharge.  ED Discharge Orders         Ordered    metroNIDAZOLE (FLAGYL) 500 MG tablet  2 times daily     12/01/18 [address], [place], PA-C 12/01/18 1756    Tegeler, Canary Brim, MD 12/02/18 [phone]

## 2018-12-01 NOTE — ED Triage Notes (Signed)
Pt c/o lower abd pain that began this morning ; pt states she had this pain before and was told she has a cyst to her ovary; pt denies any vaginal discharge ; pt denies any burning with urination

## 2018-12-02 LAB — GC/CHLAMYDIA PROBE AMP (~~LOC~~) NOT AT ARMC
Chlamydia: NEGATIVE
NEISSERIA GONORRHEA: NEGATIVE

## 2018-12-22 ENCOUNTER — Ambulatory Visit: Payer: Medicaid Other | Admitting: Obstetrics & Gynecology

## 2019-01-04 ENCOUNTER — Ambulatory Visit: Payer: Medicaid Other | Admitting: Obstetrics and Gynecology

## 2019-01-11 ENCOUNTER — Emergency Department (HOSPITAL_COMMUNITY): Payer: Medicaid Other

## 2019-01-11 ENCOUNTER — Other Ambulatory Visit: Payer: Self-pay

## 2019-01-11 ENCOUNTER — Emergency Department (HOSPITAL_COMMUNITY)
Admission: EM | Admit: 2019-01-11 | Discharge: 2019-01-11 | Disposition: A | Discharge status: Home / Self Care | Payer: Medicaid Other | Attending: Emergency Medicine | Admitting: Emergency Medicine

## 2019-01-11 ENCOUNTER — Encounter (HOSPITAL_COMMUNITY): Payer: Self-pay | Admitting: *Deleted

## 2019-01-11 DIAGNOSIS — J209 Acute bronchitis, unspecified: Secondary | ICD-10-CM | POA: Diagnosis not present

## 2019-01-11 DIAGNOSIS — Z720 Tobacco use: Secondary | ICD-10-CM

## 2019-01-11 DIAGNOSIS — F17210 Nicotine dependence, cigarettes, uncomplicated: Secondary | ICD-10-CM | POA: Insufficient documentation

## 2019-01-11 DIAGNOSIS — I10 Essential (primary) hypertension: Secondary | ICD-10-CM | POA: Diagnosis not present

## 2019-01-11 DIAGNOSIS — R05 Cough: Secondary | ICD-10-CM | POA: Diagnosis present

## 2019-01-11 DIAGNOSIS — R0981 Nasal congestion: Secondary | ICD-10-CM | POA: Diagnosis not present

## 2019-01-11 MED ORDER — LORATADINE 10 MG PO TABS: 10.0000 mg | ORAL_TABLET | Freq: Every day | ORAL | 0 refills | Status: DC

## 2019-01-11 MED ORDER — LORATADINE 10 MG PO TABS
10.0000 mg | ORAL_TABLET | Freq: Once | ORAL | Status: AC
  Administered 2019-01-11: 10 mg via ORAL
  Filled 2019-01-11: qty 1

## 2019-01-11 MED ORDER — DOXYCYCLINE HYCLATE 100 MG PO CAPS: 100.0000 mg | ORAL_CAPSULE | Freq: Two times a day (BID) | ORAL | 0 refills | Status: DC

## 2019-01-11 MED ORDER — OXYMETAZOLINE HCL 0.05 % NA SOLN
1.0000 | Freq: Once | NASAL | Status: AC
  Administered 2019-01-11: 15:00:00 1 via NASAL
  Filled 2019-01-11: qty 30

## 2019-01-11 NOTE — ED Provider Notes (Signed)
[facility]ERGENC[facility]PART[facility] Provider Note   CSN: [id number] Arrival date & time: 01/11/19  1405    History   Chief Complaint Chief Complaint  Patient presents with  . Cough    HPI Christina Ingram is a 23 y.o. female.     Pt presents to the ED today with a cough for a month.  Pt said she thinks the pollen is making it worse.  She has been bringing up yellow sputum, sometimes tinged with blood.  She denies fever.  Her ears sometimes feel like they are popping.  She went to give plasma today and was told she had to come to the hospital.     Past Medical History:  Diagnosis Date  . Hypertension     Patient Active Problem List   Diagnosis Date Noted  . Nexplanon insertion 06/18/2015  . 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 IUGR (intrauterine growth restriction) 04/21/2015    Past Surgical History:  Procedure Laterality Date  . WISDOM TOOTH EXTRACTION       OB History    Gravida  1   Para  1   Term  1   Preterm      AB      Living  1     SAB      TAB      Ectopic      Multiple  0   Live Births  1            Home Medications    Prior to Admission medications   Medication Sig Start Date End Date Taking? Authorizing Provider  azithromycin (ZITHROMAX Z-PAK) 250 MG tablet Use as directed Patient not taking: Reported on 12/01/2018 12/27/17   Christina Ingram, Christina Ingram, CNM  cyclobenzaprine (FLEXERIL) 10 MG tablet Take 1 tablet (10 mg total) by mouth 3 (three) times daily. Patient not taking: Reported on 12/01/2018 01/18/18   Ivery QualeBryant, Hobson, PA-C  doxycycline (VIBRAMYCIN) 100 MG capsule Take 1 capsule (100 mg total) by mouth 2 (two) times daily. 01/11/19   Jacalyn LefevreHaviland, Christina Pangallo, MD  ibuprofen (ADVIL,MOTRIN) 600 MG tablet Take 1 tablet (600 mg total) by mouth 4 (four) times daily. Patient not taking: Reported on 12/01/2018 01/18/18   Ivery QualeBryant, Hobson, PA-C  loratadine (CLARITIN) 10 MG tablet Take 1 tablet (10 mg total) by mouth daily. 01/11/19    Jacalyn LefevreHaviland, Joseangel Nettleton, MD  naproxen (NAPROSYN) 500 MG tablet Take 1 tablet (500 mg total) by mouth 2 (two) times daily. Patient not taking: Reported on 12/01/2018 11/28/18   Robinson, [place] N, PA-C  potassium chloride SA (K-DUR,KLOR-CON) 20 MEQ tablet Take 1 tablet (20 mEq total) by mouth daily. Patient not taking: Reported on 12/01/2018 11/28/18   Arthor CaptainHarris, Abigail, PA-C  Prenat w/o A-FeCbGl-DSS-FA-DHA [facility] ASSURE) 35-1 & 300 MG tablet One tablet and one capsule daily Patient not taking: Reported on 12/01/2018 12/27/17   Christina Ingram, Christina Ingram, CNM  traMADol (ULTRAM) 50 MG tablet Take 1 tablet (50 mg total) by mouth every 6 (six) hours as needed. Patient not taking: Reported on 12/01/2018 01/18/18   Ivery QualeBryant, Hobson, PA-C    Family History Family History  Problem Relation Age of Onset  . Cancer Mother   . Clotting disorder Mother   . Venous thrombosis Mother   . Hypertension Mother   . Diabetes Maternal Grandmother   . Hypertension Maternal Grandmother   . Diabetes Maternal Grandfather     Social History Social History   Tobacco Use  . Smoking status: Current Every Day  Smoker    Packs/day: 0.25    Types: Cigarettes  . Smokeless tobacco: Never Used  Substance Use Topics  . Alcohol use: No  . Drug use: No     Allergies   Penicillins   Review of Systems Review of Systems  HENT: Positive for congestion.   Respiratory: Positive for cough.   All other systems reviewed and are negative.    Physical Exam Updated Vital Signs BP 138/74 (BP Location: Right Arm)   Pulse 74   Temp 98 F (36.7 C) (Oral)   Resp 20   LMP 12/22/2018 (Within Days)   SpO2 100%   Physical Exam Vitals signs and nursing note reviewed.  Constitutional:      Appearance: Normal appearance. She is normal weight.  HENT:     Head: Normocephalic and atraumatic.     Right Ear: External ear normal.     Left Ear: External ear normal.     Nose: Nose normal.     Mouth/Throat:     Mouth: Mucous membranes are moist.   Eyes:     Extraocular Movements: Extraocular movements intact.     Pupils: Pupils are equal, round, and reactive to light.  Neck:     Musculoskeletal: Normal range of motion and neck supple.  Cardiovascular:     Rate and Rhythm: Normal rate and regular rhythm.     Pulses: Normal pulses.     Heart sounds: Normal heart sounds.  Pulmonary:     Effort: Pulmonary effort is normal.     Breath sounds: Normal breath sounds.  Abdominal:     General: Abdomen is flat. Bowel sounds are normal.     Palpations: Abdomen is soft.  Musculoskeletal: Normal range of motion.  Skin:    General: Skin is warm.     Capillary Refill: Capillary refill takes less than 2 seconds.  Neurological:     General: No focal deficit present.     Mental Status: She is alert and oriented to person, place, and time.  Psychiatric:        Mood and Affect: Mood normal.        Behavior: Behavior normal.      ED Treatments / Results  Labs (all labs ordered are listed, but only abnormal results are displayed) Labs Reviewed - No data to display  EKG None  Radiology Dg Chest Portable 1 View  Result Date: 01/11/2019 CLINICAL DATA:  23 year old female with a history of cough EXAM: PORTABLE CHEST 1 VIEW COMPARISON:  05/04/2017 FINDINGS: The heart size and mediastinal contours are within normal limits. Both lungs are clear. The visualized skeletal structures are unremarkable. IMPRESSION: Negative for acute cardiopulmonary disease Electronically Signed   By: Gilmer Mor D.O.   On: 01/11/2019 15:13    Procedures Procedures (including critical care time)  Medications Ordered in ED Medications  oxymetazoline (AFRIN) 0.05 % nasal spray 1 spray (1 spray Each Nare Given 01/11/19 1450)  loratadine (CLARITIN) tablet 10 mg (10 mg Oral Given 01/11/19 1450)     Initial Impression / Assessment and Plan / ED Course  I have reviewed the triage vital signs and the nursing notes.  Pertinent labs & imaging results that were  available during my care of the patient were reviewed by me and considered in my medical decision making (see chart for details).  Pt likely has bronchitis with sx going on for a month with productive sputum.  she will be treated with doxy, claritin, afrin.  She is encouraged to  not smoke. The pt does not meet criteria for covid testing.  Pt is stable for d/c.      Christina Ingram was evaluated in Emergency Department on 01/11/2019 for the symptoms described in the history of present illness. She was evaluated in the context of the global COVID-19 pandemic, which necessitated consideration that the patient might be at risk for infection with the SARS-CoV-2 virus that causes COVID-19. Institutional protocols and algorithms that pertain to the evaluation of patients at risk for COVID-19 are in a state of rapid change based on information released by regulatory bodies including the CDC and federal and state organizations. These policies and algorithms were followed during the patient's care in the ED.  Final Clinical Impressions(s) / ED Diagnoses   Final diagnoses:  Acute bronchitis, unspecified organism  Tobacco abuse    ED Discharge Orders         Ordered    doxycycline (VIBRAMYCIN) 100 MG capsule  2 times daily     01/11/19 1529    loratadine (CLARITIN) 10 MG tablet  Daily     01/11/19 1529           Jacalyn Lefevre, MD 01/11/19 1530

## 2019-01-11 NOTE — ED Triage Notes (Signed)
Pt in c/o cough for the last month, reports she initially had a fever but has not had one for the last 3 weeks, states she went to work today and due to cough she was sent here for further evaluation
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
## 2019-03-31 ENCOUNTER — Encounter (HOSPITAL_COMMUNITY): Payer: Self-pay | Admitting: *Deleted

## 2019-03-31 ENCOUNTER — Emergency Department (HOSPITAL_COMMUNITY)
Admission: EM | Admit: 2019-03-31 | Discharge: 2019-03-31 | Disposition: A | Discharge status: Home / Self Care | Payer: Medicaid Other | Attending: Emergency Medicine | Admitting: Emergency Medicine

## 2019-03-31 DIAGNOSIS — Y999 Unspecified external cause status: Secondary | ICD-10-CM | POA: Diagnosis not present

## 2019-03-31 DIAGNOSIS — M791 Myalgia, unspecified site: Secondary | ICD-10-CM

## 2019-03-31 DIAGNOSIS — I10 Essential (primary) hypertension: Secondary | ICD-10-CM | POA: Insufficient documentation

## 2019-03-31 DIAGNOSIS — M542 Cervicalgia: Secondary | ICD-10-CM | POA: Insufficient documentation

## 2019-03-31 DIAGNOSIS — M25511 Pain in right shoulder: Secondary | ICD-10-CM | POA: Diagnosis not present

## 2019-03-31 DIAGNOSIS — M549 Dorsalgia, unspecified: Secondary | ICD-10-CM | POA: Insufficient documentation

## 2019-03-31 DIAGNOSIS — Y939 Activity, unspecified: Secondary | ICD-10-CM | POA: Insufficient documentation

## 2019-03-31 DIAGNOSIS — S4991XA Unspecified injury of right shoulder and upper arm, initial encounter: Secondary | ICD-10-CM | POA: Diagnosis present

## 2019-03-31 DIAGNOSIS — F17210 Nicotine dependence, cigarettes, uncomplicated: Secondary | ICD-10-CM | POA: Insufficient documentation

## 2019-03-31 DIAGNOSIS — Y929 Unspecified place or not applicable: Secondary | ICD-10-CM | POA: Insufficient documentation

## 2019-03-31 MED ORDER — TIZANIDINE HCL 2 MG PO CAPS: 2.0000 mg | ORAL_CAPSULE | Freq: Three times a day (TID) | ORAL | 0 refills | Status: DC | PRN

## 2019-03-31 MED ORDER — IBUPROFEN 800 MG PO TABS: 800.0000 mg | ORAL_TABLET | Freq: Three times a day (TID) | ORAL | 0 refills | Status: DC

## 2019-03-31 MED ORDER — TIZANIDINE HCL 4 MG PO TABS
2.0000 mg | ORAL_TABLET | Freq: Once | ORAL | Status: AC
  Administered 2019-03-31: 03:00:00 2 mg via ORAL
  Filled 2019-03-31: qty 1

## 2019-03-31 MED ORDER — KETOROLAC TROMETHAMINE 60 MG/2ML IM SOLN
30.0000 mg | Freq: Once | INTRAMUSCULAR | Status: AC
  Administered 2019-03-31: 30 mg via INTRAMUSCULAR
  Filled 2019-03-31: qty 2
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
## 2019-03-31 NOTE — ED Notes (Signed)
Ortho tech at bedside 

## 2020-01-29 ENCOUNTER — Encounter (HOSPITAL_COMMUNITY): Payer: Self-pay

## 2020-01-29 ENCOUNTER — Emergency Department (HOSPITAL_COMMUNITY)
Admission: EM | Admit: 2020-01-29 | Discharge: 2020-01-30 | Disposition: A | Discharge status: Home / Self Care | Payer: Medicaid Other | Attending: Emergency Medicine | Admitting: Emergency Medicine

## 2020-01-29 ENCOUNTER — Other Ambulatory Visit: Payer: Self-pay

## 2020-01-29 DIAGNOSIS — L03011 Cellulitis of right finger: Secondary | ICD-10-CM

## 2020-01-29 DIAGNOSIS — F17210 Nicotine dependence, cigarettes, uncomplicated: Secondary | ICD-10-CM | POA: Insufficient documentation

## 2020-01-29 DIAGNOSIS — Z79899 Other long term (current) drug therapy: Secondary | ICD-10-CM | POA: Insufficient documentation

## 2020-01-29 DIAGNOSIS — M79644 Pain in right finger(s): Secondary | ICD-10-CM | POA: Diagnosis present

## 2020-01-29 DIAGNOSIS — I10 Essential (primary) hypertension: Secondary | ICD-10-CM | POA: Insufficient documentation
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
## 2020-01-30 ENCOUNTER — Emergency Department (HOSPITAL_COMMUNITY): Payer: Medicaid Other

## 2020-01-30 MED ORDER — LIDOCAINE HCL (PF) 1 % IJ SOLN
5.0000 mL | Freq: Once | INTRAMUSCULAR | Status: AC
  Administered 2020-01-30: 5 mL via INTRADERMAL
  Filled 2020-01-30: qty 5

## 2020-01-30 NOTE — ED Provider Notes (Signed)
Please see Dr. Jolyn Nap note for full H&P.   INCISION AND DRAINAGE Performed by: Harvie Heck PA-C Consent: Verbal consent obtained. Risks and benefits: risks, benefits and alternatives were discussed Type: abscess, paronychia  Body area: right thumb.  Anesthesia: right thumb digital block.  Local anesthetic: lidocaine 1% wo epinephrine Incision was made with a scalpel. Anesthetic total: 3 ml Drainage: purulent Drainage amount: moderate.  Patient tolerance: Patient tolerated the procedure well with no immediate complications.     Cherly Anderson, PA-C 01/30/20 0254    Ward, Layla Maw, DO 01/30/20 5146
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
## 2020-01-30 NOTE — Discharge Instructions (Signed)
You may alternate Tylenol 1000 mg every 6 hours as needed for pain, fever and Ibuprofen 800 mg every 8 hours as needed for pain, fever.  Please take Ibuprofen with food.  Do not take more than 4000 mg of Tylenol (acetaminophen) in a 24 hour period.  Your x-ray showed no fracture or dislocation.  Your paronychia has been drained here in the emergency department.  You do not need to be discharged on antibiotics.  I recommend you should soak your finger in warm water 3-4 times a day for 10 to 15 minutes at a time for the next several days to encourage continued drainage.
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
## 2021-01-24 ENCOUNTER — Encounter (HOSPITAL_COMMUNITY): Payer: Self-pay | Admitting: *Deleted

## 2021-01-24 ENCOUNTER — Other Ambulatory Visit: Payer: Self-pay

## 2021-01-24 ENCOUNTER — Emergency Department (HOSPITAL_COMMUNITY)
Admission: EM | Admit: 2021-01-24 | Discharge: 2021-01-24 | Disposition: A | Discharge status: Home / Self Care | Payer: Medicaid Other | Attending: Emergency Medicine | Admitting: Emergency Medicine

## 2021-01-24 DIAGNOSIS — Z5321 Procedure and treatment not carried out due to patient leaving prior to being seen by health care provider: Secondary | ICD-10-CM | POA: Insufficient documentation

## 2021-01-24 DIAGNOSIS — M549 Dorsalgia, unspecified: Secondary | ICD-10-CM | POA: Insufficient documentation

## 2021-01-24 DIAGNOSIS — M545 Low back pain, unspecified: Secondary | ICD-10-CM

## 2021-01-24 DIAGNOSIS — R1030 Lower abdominal pain, unspecified: Secondary | ICD-10-CM | POA: Insufficient documentation

## 2021-01-24 LAB — POC URINE PREG, ED: Preg Test, Ur: NEGATIVE

## 2021-01-24 NOTE — ED Notes (Signed)
Signature pad for MSE not working , verbal instructed patient about staying for completed care.

## 2021-01-24 NOTE — ED Notes (Signed)
This patient seemed to have left without telling any nursing staff and before being evaluated before Sophia, PA reached her for further assessment. Unable to advise of risks of leaving AMA. Unable to obtain discharge vitals since patient left AMA.

## 2021-01-24 NOTE — ED Triage Notes (Signed)
Patient c/o back pain , states her LMP was last month , states she has taken 2 home preg test and they were positive. Denies vag. Bleeding. C/o lower vaginal pressure at times.

## 2021-01-24 NOTE — ED Provider Notes (Signed)
Emergency Medicine Provider OB Triage Evaluation Note  Christina Ingram is a 25 y.o. female, G1P1001, at Unknown gestation who presents to the emergency department with complaints of pregnancy and low back pain.  Patient reports that her last menstrual cycle was last month but last month she took 24 pregnancy test negative.  She reports that she was taken to positive pregnancy test with last day positive.  She reports she started having some bilateral back soreness and aching.  No abdominal pain or bloating.  No nausea vomiting constipation, diarrhea, urinary changes.  Of note chest pain or shortness of breath today.  No other complaints.  As she does have a pregnancy test in the chart, a POC Urine pregnancy was ordered.  Review of  Systems  Positive: mild low back ache Negative: chest pain today, SOB, syncope, vaginal bleeding, pelvic pain   Physical Exam  BP 126/83 (BP Location: Left Arm)   Pulse 80   Temp 99.2 F (37.3 C) (Oral)   Resp 18   SpO2 100%  General: Awake, no distress  HEENT: Atraumatic  Resp: Normal effort  Cardiac: Normal rate Abd: Nondistended, nontender  MSK: Moves all extremities without difficulty Neuro: Speech clear  Medical Decision Making  Pregnancy test just returned negative.  Patient will be taken back to the waiting room and will await placement in the emergency department for full evaluation of her low back pain today which does not appear to be related to pregnancy   Clinical Impression  No diagnosis found.     Dontrae Morini, Canary Brim, MD 01/24/21 1102

## 2021-01-24 NOTE — ED Provider Notes (Signed)
Patient was brought back to a room, but left prior to my evaluation.  However she was seen in triage by another provider.   Alveria Apley, PA-C 01/24/21 1123    Wynetta Fines, MD 01/25/21 [phone]

## 2021-04-19 ENCOUNTER — Emergency Department (HOSPITAL_COMMUNITY): Payer: Medicaid Other

## 2021-04-19 ENCOUNTER — Emergency Department (HOSPITAL_COMMUNITY)
Admission: EM | Admit: 2021-04-19 | Discharge: 2021-04-20 | Disposition: A | Discharge status: Home / Self Care | Payer: Medicaid Other | Attending: Emergency Medicine | Admitting: Emergency Medicine

## 2021-04-19 ENCOUNTER — Other Ambulatory Visit: Payer: Self-pay

## 2021-04-19 DIAGNOSIS — R102 Pelvic and perineal pain: Secondary | ICD-10-CM | POA: Insufficient documentation

## 2021-04-19 DIAGNOSIS — N83201 Unspecified ovarian cyst, right side: Secondary | ICD-10-CM | POA: Diagnosis not present

## 2021-04-19 DIAGNOSIS — Z87891 Personal history of nicotine dependence: Secondary | ICD-10-CM | POA: Diagnosis not present

## 2021-04-19 DIAGNOSIS — I10 Essential (primary) hypertension: Secondary | ICD-10-CM | POA: Insufficient documentation

## 2021-04-19 DIAGNOSIS — R1084 Generalized abdominal pain: Secondary | ICD-10-CM | POA: Diagnosis present

## 2021-04-19 LAB — I-STAT BETA HCG BLOOD, ED (MC, WL, AP ONLY): I-stat hCG, quantitative: <5 m[IU]/mL (ref <5)

## 2021-04-19 LAB — URINALYSIS, ROUTINE W REFLEX MICROSCOPIC
Bilirubin Urine: NEGATIVE
Glucose, UA: NEGATIVE mg/dL
Hgb urine dipstick: NEGATIVE
Ketones, ur: NEGATIVE mg/dL
Leukocytes,Ua: NEGATIVE
Nitrite: NEGATIVE
Protein, ur: NEGATIVE mg/dL
Specific Gravity, Urine: 1.016 (ref 1.005–1.030)
pH: 9 — ABNORMAL HIGH (ref 5.0–8.0)

## 2021-04-19 LAB — COMPREHENSIVE METABOLIC PANEL
ALT: 11 U/L (ref 0–44)
AST: 18 U/L (ref 15–41)
Albumin: 3.8 g/dL (ref 3.5–5.0)
Alkaline Phosphatase: 47 U/L (ref 38–126)
Anion gap: 6 (ref 5–15)
BUN: 11 mg/dL (ref 6–20)
CO2: 26 mmol/L (ref 22–32)
Calcium: 9.7 mg/dL (ref 8.9–10.3)
Chloride: 104 mmol/L (ref 98–111)
Creatinine, Ser: 0.82 mg/dL (ref 0.44–1.00)
GFR, Estimated: >60 mL/min (ref >60)
Glucose, Bld: 101 mg/dL — ABNORMAL HIGH (ref 70–99)
Potassium: 3.7 mmol/L (ref 3.5–5.1)
Sodium: 136 mmol/L (ref 135–145)
Total Bilirubin: 0.3 mg/dL (ref 0.3–1.2)
Total Protein: 6.6 g/dL (ref 6.5–8.1)

## 2021-04-19 LAB — CBC WITH DIFFERENTIAL/PLATELET
Abs Immature Granulocytes: 0.01 10*3/uL (ref 0.00–0.07)
Basophils Absolute: 0 10*3/uL (ref 0.0–0.1)
Basophils Relative: 0 %
Eosinophils Absolute: 0.1 10*3/uL (ref 0.0–0.5)
Eosinophils Relative: 2 %
HCT: 38.2 % (ref 36.0–46.0)
Hemoglobin: 12.1 g/dL (ref 12.0–15.0)
Immature Granulocytes: 0 %
Lymphocytes Relative: 35 %
Lymphs Abs: 2.6 10*3/uL (ref 0.7–4.0)
MCH: 29.2 pg (ref 26.0–34.0)
MCHC: 31.7 g/dL (ref 30.0–36.0)
MCV: 92.3 fL (ref 80.0–100.0)
Monocytes Absolute: 0.6 10*3/uL (ref 0.1–1.0)
Monocytes Relative: 8 %
Neutro Abs: 4.1 10*3/uL (ref 1.7–7.7)
Neutrophils Relative %: 55 %
Platelets: 289 10*3/uL (ref 150–400)
RBC: 4.14 MIL/uL (ref 3.87–5.11)
RDW: 12.3 % (ref 11.5–15.5)
WBC: 7.4 10*3/uL (ref 4.0–10.5)
nRBC: 0 % (ref 0.0–0.2)

## 2021-04-19 LAB — LIPASE, BLOOD: Lipase: 40 U/L (ref 11–51)

## 2021-04-19 NOTE — ED Provider Notes (Signed)
Emergency Medicine Provider Triage Evaluation Note  Radene Gunning , a 25 y.o. female  was evaluated in triage.  Pt complains of pelvic pain.  Patient states that she has experienced chronic pelvic pain due to a known right-sided ovarian cyst.  She states that earlier today her pain once again began worsening.  Reports associated nausea.  No vomiting, diarrhea, urinary complaints, vaginal discharge.  Physical Exam  BP 132/88 (BP Location: Right Arm)   Pulse 86   Temp 98.9 F (37.2 C) (Oral)   Resp (!) 22   SpO2 100%  Gen:   Awake, no distress   Resp:  Normal effort  MSK:   Moves extremities without difficulty  Other:  Abdomen is soft.  Diffuse tenderness appreciated across the abdomen.  Medical Decision Making  Medically screening exam initiated at 7:41 PM.  Appropriate orders placed.  MERLYN BOLLEN was informed that the remainder of the evaluation will be completed by another provider, this initial triage assessment does not replace that evaluation, and the importance of remaining in the ED until their evaluation is complete.   Placido Sou, PA-C 04/19/21 1942    Cheryll Cockayne, MD 04/22/21 [phone]

## 2021-04-19 NOTE — ED Triage Notes (Signed)
Pt arrives POV for eval of generalized abd pain. Pt reports onset at 5 pm today, when asked gestures that pain is "all over" and radiates to back. Denies chance of pregnancy, LMP 7/8. Endorses nausea, no vomiting, diarrhea.

## 2021-04-20 MED ORDER — OXYCODONE HCL 5 MG PO TABS
5.0000 mg | ORAL_TABLET | Freq: Once | ORAL | Status: AC
  Administered 2021-04-20: 5 mg via ORAL
  Filled 2021-04-20: qty 1

## 2021-04-20 MED ORDER — ONDANSETRON 4 MG PO TBDP
4.0000 mg | ORAL_TABLET | Freq: Once | ORAL | Status: AC
  Administered 2021-04-20: 4 mg via ORAL
  Filled 2021-04-20: qty 1

## 2021-04-20 MED ORDER — OXYCODONE HCL 5 MG PO TABS: 5.0000 mg | ORAL_TABLET | Freq: Four times a day (QID) | ORAL | 0 refills | Status: DC | PRN

## 2021-04-20 MED ORDER — ONDANSETRON HCL 4 MG PO TABS: 4.0000 mg | ORAL_TABLET | Freq: Four times a day (QID) | ORAL | 0 refills | Status: DC

## 2021-04-20 NOTE — ED Notes (Signed)
Pt seen by EDP prior to RN assessment, see MD notes, orders received and initiated, labs and imaging results reviewed.

## 2021-04-20 NOTE — ED Provider Notes (Signed)
[facility] EMERGENCY DEPARTMENT Provider Note   CSN: [id number] Arrival date & time: 04/19/21  1925     History Chief Complaint  Patient presents with   Abdominal Pain    Christina Ingram is a 25 y.o. female.  The history is provided by the patient.  Abdominal Pain Pain location:  Generalized Pain quality: aching   Pain radiates to:  Does not radiate Pain severity:  Mild Onset quality:  Gradual Timing:  Intermittent Progression:  Waxing and waning Chronicity:  Recurrent Context comment:  Has chronic pelvic pain due to known right-sided ovarian cyst.  Pain flared up again today.  Has improved.  Denies any vaginal discharge or bleeding. 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   IUGR (intrauterine growth restriction) 04/21/2015    Past Surgical History:  Procedure Laterality Date   WISDOM TOOTH EXTRACTION       OB History     Gravida  1   Para  1   Term  1   Preterm      AB      Living  1      SAB      IAB      Ectopic      Multiple  0   Live Births  1           Family History  Problem Relation Age of Onset   Cancer Mother    Clotting disorder Mother    Venous thrombosis Mother    Hypertension Mother    Diabetes Maternal Grandmother    Hypertension Maternal Grandmother    Diabetes Maternal Grandfather     Social History   Tobacco Use   Smoking status: Former    Packs/day: 0.25    Types: Cigarettes   Smokeless tobacco: Never  Vaping Use   Vaping Use: Never used   Substance Use Topics   Alcohol use: No   Drug use: No    Home Medications Prior to Admission medications   Medication Sig Start Date End Date Taking? Authorizing Provider  ondansetron (ZOFRAN) 4 MG tablet Take 1 tablet (4 mg total) by mouth every 6 (six) hours. 04/20/21  Yes Tahara Ruffini, DO  oxyCODONE (ROXICODONE) 5 MG immediate release tablet Take 1 tablet (5 mg total) by mouth every 6 (six) hours as needed for up to 15 doses for severe pain. 04/20/21  Yes Dwyne Hasegawa, DO  doxycycline (VIBRAMYCIN) 100 MG capsule Take 1 capsule (100 mg total) by mouth 2 (two) times daily. 01/11/19   Jacalyn Lefevre, MD  ibuprofen (ADVIL) 800 MG tablet Take 1 tablet (800 mg total) by mouth 3 (three) times daily. 03/31/19   Mesner, Barbara Cower, MD  loratadine (CLARITIN) 10 MG tablet Take 1 tablet (10 mg total) by mouth daily. 01/11/19   Jacalyn Lefevre, MD  tizanidine (ZANAFLEX) 2 MG capsule Take 1 capsule (2 mg total) by mouth 3 (three) times daily as needed for muscle spasms. 03/31/19   Mesner, Barbara Cower, MD  potassium chloride SA (K-DUR,KLOR-CON) 20  MEQ tablet Take 1 tablet (20 mEq total) by mouth daily. Patient not taking: Reported on 12/01/2018 11/28/18 03/31/19  Arthor CaptainHarris, Abigail, PA-C    Allergies    Penicillins  Review of Systems   Review of Systems  Constitutional:  Negative for chills, fatigue and fever.  HENT:  Negative for ear pain and sore throat.   Eyes:  Negative for pain and visual disturbance.  Respiratory:  Negative for cough and shortness of breath.   Cardiovascular:  Negative for chest pain and palpitations.  Gastrointestinal:  Positive for abdominal pain. Negative for diarrhea, nausea and vomiting.  Genitourinary:  Negative for dysuria, hematuria, vaginal bleeding and vaginal discharge.  Musculoskeletal:  Negative for arthralgias and back pain.  Skin:  Negative for color change and rash.  Neurological:  Negative for seizures and syncope.  All other systems reviewed and are negative.  Physical  Exam Updated Vital Signs BP 109/70 (BP Location: Right Arm)   Pulse 64   Temp 98.4 F (36.9 C) (Oral)   Resp 16   Ht 5' (1.524 m)   Wt 77 kg   SpO2 100%   BMI 33.15 kg/m   Physical Exam Vitals and nursing note reviewed.  Constitutional:      General: Christina Ingram is not in acute distress.    Appearance: Christina Ingram is well-developed. Christina Ingram is not ill-appearing.  HENT:     Head: Normocephalic and atraumatic.     Mouth/Throat:     Mouth: Mucous membranes are moist.  Eyes:     Extraocular Movements: Extraocular movements intact.     Conjunctiva/sclera: Conjunctivae normal.     Pupils: Pupils are equal, round, and reactive to light.  Cardiovascular:     Rate and Rhythm: Normal rate and regular rhythm.     Pulses: Normal pulses.     Heart sounds: Normal heart sounds. No murmur heard. Pulmonary:     Effort: Pulmonary effort is normal. No respiratory distress.     Breath sounds: Normal breath sounds.  Abdominal:     Palpations: Abdomen is soft.     Tenderness: There is no abdominal tenderness.  Musculoskeletal:     Cervical back: Neck supple.  Skin:    General: Skin is warm and dry.     Capillary Refill: Capillary refill takes less than 2 seconds.  Neurological:     General: No focal deficit present.     Mental Status: Christina Ingram is alert.    ED Results / Procedures / Treatments   Labs (all labs ordered are listed, but only abnormal results are displayed) Labs Reviewed  COMPREHENSIVE METABOLIC PANEL - Abnormal; Notable for the following components:      Result Value   Glucose, Bld 101 (*)    All other components within normal limits  URINALYSIS, ROUTINE W REFLEX MICROSCOPIC - Abnormal; Notable for the following components:   APPearance HAZY (*)    pH 9.0 (*)    All other components within normal limits  CBC WITH DIFFERENTIAL/PLATELET  LIPASE, BLOOD  I-STAT BETA HCG BLOOD, ED ([facility], [facility], [facility] ONLY)    EKG None  Radiology US Pelvis Complete  Result Date: 04/19/2021 CLINICAL DATA:  Right  lower quadrant pain. History of ovarian cyst. Pain worsened today. EXAM: TRANSABDOMINAL ULTRASOUND OF PELVIS DOPPLER ULTRASOUND OF OVARIES TECHNIQUE: Transabdominal ultrasound examination of the pelvis was performed including evaluation of the uterus, ovaries, adnexal regions, and pelvic cul-de-sac. Color and duplex Doppler ultrasound was utilized to evaluate blood flow to the ovaries. COMPARISON:  03/17/2017. FINDINGS: Uterus  Measurements: 7.4 x 5.8 x 4.8 cm = volume: 109.6 mL. No fibroids or other mass visualized. Endometrium Thickness: 13 mm.  No focal abnormality visualized. Right ovary Measurements: 3.8 x 2.7 x 3.2 cm = volume: 17.7 mL. Complex 2.2 cm structure consistent with a hemorrhagic cyst or corpus luteum. Ovary otherwise unremarkable. No adnexal masses. Left ovary Measurements: 3.3 x 3.1 x 2.5 cm = volume: There is 13.1 mL. Normal appearance/no adnexal mass. Pulsed Doppler evaluation demonstrates normal low-resistance arterial and venous waveforms in both ovaries. Other: No abnormal pelvic free fluid. IMPRESSION: 1. Small complex cyst, 2.2 cm, arising from the right ovary, which may reflect a hemorrhagic cyst. 2. Exam otherwise unremarkable.  No ovarian torsion. Electronically Signed   By: Amie Portland M.D.   On: 04/19/2021 21:39   [place] Art/Ven Flow Abd Pelv Doppler  Result Date: 04/19/2021 CLINICAL DATA:  Right lower quadrant pain. History of ovarian cyst. Pain worsened today. EXAM: TRANSABDOMINAL ULTRASOUND OF PELVIS DOPPLER ULTRASOUND OF OVARIES TECHNIQUE: Transabdominal ultrasound examination of the pelvis was performed including evaluation of the uterus, ovaries, adnexal regions, and pelvic cul-de-sac. Color and duplex Doppler ultrasound was utilized to evaluate blood flow to the ovaries. COMPARISON:  03/17/2017. FINDINGS: Uterus Measurements: 7.4 x 5.8 x 4.8 cm = volume: 109.6 mL. No fibroids or other mass visualized. Endometrium Thickness: 13 mm.  No focal abnormality visualized. Right ovary  Measurements: 3.8 x 2.7 x 3.2 cm = volume: 17.7 mL. Complex 2.2 cm structure consistent with a hemorrhagic cyst or corpus luteum. Ovary otherwise unremarkable. No adnexal masses. Left ovary Measurements: 3.3 x 3.1 x 2.5 cm = volume: There is 13.1 mL. Normal appearance/no adnexal mass. Pulsed Doppler evaluation demonstrates normal low-resistance arterial and venous waveforms in both ovaries. Other: No abnormal pelvic free fluid. IMPRESSION: 1. Small complex cyst, 2.2 cm, arising from the right ovary, which may reflect a hemorrhagic cyst. 2. Exam otherwise unremarkable.  No ovarian torsion. Electronically Signed   By: Amie Portland M.D.   On: 04/19/2021 21:39    Procedures Procedures   Medications Ordered in ED Medications  oxyCODONE (Oxy IR/ROXICODONE) immediate release tablet 5 mg (5 mg Oral Given 04/20/21 0821)  ondansetron (ZOFRAN-ODT) disintegrating tablet 4 mg (4 mg Oral Given 04/20/21 9371)    ED Course  I have reviewed the triage vital signs and the nursing notes.  Pertinent labs & imaging results that were available during my care of the patient were reviewed by me and considered in my medical decision making (see chart for details).    MDM Rules/Calculators/A&P                           RAILYN HOUSE is here with abdominal pain.  Lab work and pelvic ultrasound done prior to my evaluation.  Patient has been waiting for about 13 hours to be seen.  Vital signs are normal.  Christina Ingram is not have any active pain.  Not having any vaginal discharge or bleeding.  No significant anemia, electro abnormality, kidney injury.  Pregnancy test is negative.  Urinalysis negative for infection.  Overall Christina Ingram thinks just acute on chronic pain.  Christina Ingram has 2.2 cm right-sided ovarian cyst but no torsion.  Christina Ingram states every once a while Christina Ingram will get severe pain but has now improved.  We will write her for oxycodone for breakthrough pain and have her follow-up with her OB/GYN.  No concern for appendicitis or other  intra-abdominal process as pain-free now with  benign abdominal exam with unremarkable vitals and lab work and imaging.  Discharged in good condition.  This chart was dictated using voice recognition software.  Despite best efforts to proofread,  errors can occur which can change the documentation meaning.  Final Clinical Impression(s) / ED Diagnoses Final diagnoses:  Cyst of right ovary    Rx / DC Orders ED Discharge Orders          Ordered    ondansetron (ZOFRAN) 4 MG tablet  Every 6 hours        04/20/21 0829    oxyCODONE (ROXICODONE) 5 MG immediate release tablet  Every 6 hours PRN        04/20/21 0829             Virgina Norfolk, DO 04/20/21 [phone]

## 2021-04-25 ENCOUNTER — Encounter (HOSPITAL_COMMUNITY): Payer: Self-pay

## 2021-04-25 ENCOUNTER — Emergency Department (HOSPITAL_COMMUNITY): Payer: Medicaid Other

## 2021-04-25 ENCOUNTER — Emergency Department (HOSPITAL_COMMUNITY)
Admission: EM | Admit: 2021-04-25 | Discharge: 2021-04-25 | Disposition: A | Discharge status: Home / Self Care | Payer: Medicaid Other | Attending: Emergency Medicine | Admitting: Emergency Medicine

## 2021-04-25 DIAGNOSIS — I10 Essential (primary) hypertension: Secondary | ICD-10-CM | POA: Diagnosis not present

## 2021-04-25 DIAGNOSIS — N9489 Other specified conditions associated with female genital organs and menstrual cycle: Secondary | ICD-10-CM | POA: Insufficient documentation

## 2021-04-25 DIAGNOSIS — Z87891 Personal history of nicotine dependence: Secondary | ICD-10-CM | POA: Diagnosis not present

## 2021-04-25 DIAGNOSIS — R1013 Epigastric pain: Secondary | ICD-10-CM | POA: Insufficient documentation

## 2021-04-25 DIAGNOSIS — R079 Chest pain, unspecified: Secondary | ICD-10-CM

## 2021-04-25 DIAGNOSIS — M549 Dorsalgia, unspecified: Secondary | ICD-10-CM | POA: Insufficient documentation

## 2021-04-25 DIAGNOSIS — R1084 Generalized abdominal pain: Secondary | ICD-10-CM

## 2021-04-25 DIAGNOSIS — R0602 Shortness of breath: Secondary | ICD-10-CM | POA: Diagnosis not present

## 2021-04-25 DIAGNOSIS — Z20822 Contact with and (suspected) exposure to covid-19: Secondary | ICD-10-CM | POA: Diagnosis not present

## 2021-04-25 LAB — COMPREHENSIVE METABOLIC PANEL
ALT: 11 U/L (ref 0–44)
AST: 15 U/L (ref 15–41)
Albumin: 4.2 g/dL (ref 3.5–5.0)
Alkaline Phosphatase: 49 U/L (ref 38–126)
Anion gap: 4 — ABNORMAL LOW (ref 5–15)
BUN: 11 mg/dL (ref 6–20)
CO2: 27 mmol/L (ref 22–32)
Calcium: 9.2 mg/dL (ref 8.9–10.3)
Chloride: 106 mmol/L (ref 98–111)
Creatinine, Ser: 0.88 mg/dL (ref 0.44–1.00)
GFR, Estimated: >60 mL/min (ref >60)
Glucose, Bld: 96 mg/dL (ref 70–99)
Potassium: 3.8 mmol/L (ref 3.5–5.1)
Sodium: 137 mmol/L (ref 135–145)
Total Bilirubin: 0.2 mg/dL — ABNORMAL LOW (ref 0.3–1.2)
Total Protein: 7.2 g/dL (ref 6.5–8.1)

## 2021-04-25 LAB — URINALYSIS, ROUTINE W REFLEX MICROSCOPIC
Bilirubin Urine: NEGATIVE
Glucose, UA: NEGATIVE mg/dL
Hgb urine dipstick: NEGATIVE
Ketones, ur: NEGATIVE mg/dL
Nitrite: NEGATIVE
Protein, ur: NEGATIVE mg/dL
Specific Gravity, Urine: 1.018 (ref 1.005–1.030)
pH: 6 (ref 5.0–8.0)

## 2021-04-25 LAB — CBC WITH DIFFERENTIAL/PLATELET
Abs Immature Granulocytes: 0.01 10*3/uL (ref 0.00–0.07)
Basophils Absolute: 0 10*3/uL (ref 0.0–0.1)
Basophils Relative: 0 %
Eosinophils Absolute: 0.1 10*3/uL (ref 0.0–0.5)
Eosinophils Relative: 1 %
HCT: 38.9 % (ref 36.0–46.0)
Hemoglobin: 12.6 g/dL (ref 12.0–15.0)
Immature Granulocytes: 0 %
Lymphocytes Relative: 30 %
Lymphs Abs: 1.7 10*3/uL (ref 0.7–4.0)
MCH: 29.9 pg (ref 26.0–34.0)
MCHC: 32.4 g/dL (ref 30.0–36.0)
MCV: 92.4 fL (ref 80.0–100.0)
Monocytes Absolute: 0.4 10*3/uL (ref 0.1–1.0)
Monocytes Relative: 6 %
Neutro Abs: 3.7 10*3/uL (ref 1.7–7.7)
Neutrophils Relative %: 63 %
Platelets: 249 10*3/uL (ref 150–400)
RBC: 4.21 MIL/uL (ref 3.87–5.11)
RDW: 12 % (ref 11.5–15.5)
WBC: 5.9 10*3/uL (ref 4.0–10.5)
nRBC: 0 % (ref 0.0–0.2)

## 2021-04-25 LAB — RESP PANEL BY RT-PCR (FLU A&B, COVID) ARPGX2
Influenza A by PCR: NEGATIVE
Influenza B by PCR: NEGATIVE
SARS Coronavirus 2 by RT PCR: NEGATIVE

## 2021-04-25 LAB — I-STAT BETA HCG BLOOD, ED (MC, WL, AP ONLY): I-stat hCG, quantitative: <5 m[IU]/mL (ref <5)

## 2021-04-25 LAB — LIPASE, BLOOD: Lipase: 30 U/L (ref 11–51)

## 2021-04-25 MED ORDER — FAMOTIDINE IN NACL 20-0.9 MG/50ML-% IV SOLN
20.0000 mg | Freq: Once | INTRAVENOUS | Status: AC
  Administered 2021-04-25: 20 mg via INTRAVENOUS
  Filled 2021-04-25: qty 50

## 2021-04-25 MED ORDER — SODIUM CHLORIDE 0.9 % IV BOLUS
500.0000 mL | Freq: Once | INTRAVENOUS | Status: AC
  Administered 2021-04-25: 500 mL via INTRAVENOUS

## 2021-04-25 MED ORDER — FENTANYL CITRATE (PF) 100 MCG/2ML IJ SOLN
50.0000 ug | Freq: Once | INTRAMUSCULAR | Status: AC
  Administered 2021-04-25: 50 ug via INTRAVENOUS
  Filled 2021-04-25: qty 2

## 2021-04-25 MED ORDER — OMEPRAZOLE 20 MG PO CPDR: 20.0000 mg | DELAYED_RELEASE_CAPSULE | Freq: Every day | ORAL | 0 refills | Status: DC

## 2021-04-25 NOTE — ED Notes (Signed)
Pt has been in the restroom so unable to obtain EKG. Will get one when pt is in room.

## 2021-04-25 NOTE — ED Triage Notes (Signed)
Pt from home via [facility] for lower back and abdominal pain on left side. Seen a [facility] on 7/23, dx with ovarian cyst seen to have gotten bigger. Attempted 2 oxy last night at 8pm with no relief. Denies nausea, vomiting, urinary systems. Hx ovarian cysts.  BP 116/80 HR 76 RR 16 SpO2 100% RA Temp 97.5

## 2021-04-25 NOTE — ED Provider Notes (Signed)
[facility]-EMERGENCY DEPT Provider Note   CSN: [id number] Arrival date & time: 04/25/21  4742     History Chief Complaint  Patient presents with   Abdominal Pain    left    Christina Ingram is a 25 y.o. female.  She is here by ambulance with complaint of shortness of breath chest pain upper abdominal pain back pain that started last night.  She was here 4 days ago for lower abdominal pain and diagnosed with an ovarian cyst.  Treated with oxycodone.  No cough.  No known fevers.  Does not have a primary care doctor.  The history is provided by the patient.  Abdominal Pain Pain location:  Epigastric Pain quality: stabbing   Pain radiates to:  Back Pain severity:  Severe Onset quality:  Gradual Duration:  2 days Timing:  Constant Progression:  Unchanged Chronicity:  New Context: not trauma   Relieved by:  Nothing Worsened by:  Nothing Ineffective treatments: narcotics. 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   IUGR (intrauterine growth restriction) 04/21/2015    Past Surgical History:  Procedure Laterality Date   WISDOM TOOTH EXTRACTION       OB History     Gravida  1   Para  1   Term  1   Preterm      AB      Living  1      SAB      IAB      Ectopic      Multiple  0   Live Births  1           Family History  Problem Relation Age of Onset   Cancer Mother    Clotting disorder Mother    Venous thrombosis Mother     Hypertension Mother    Diabetes Maternal Grandmother    Hypertension Maternal Grandmother    Diabetes Maternal Grandfather     Social History   Tobacco Use   Smoking status: Former    Packs/day: 0.25    Types: Cigarettes   Smokeless tobacco: Never  Vaping Use   Vaping Use: Never used  Substance Use Topics   Alcohol use: No   Drug use: No    Home Medications Prior to Admission medications   Medication Sig Start Date End Date Taking? Authorizing Provider  doxycycline (VIBRAMYCIN) 100 MG capsule Take 1 capsule (100 mg total) by mouth 2 (two) times daily. 01/11/19   Jacalyn Lefevre, MD  ibuprofen (ADVIL) 800 MG tablet Take 1 tablet (800 mg total) by mouth 3 (three) times daily. 03/31/19   Mesner, Barbara Cower, MD  loratadine (CLARITIN) 10 MG tablet Take 1 tablet (10 mg total) by mouth daily. 01/11/19   Jacalyn Lefevre, MD  ondansetron (ZOFRAN) 4 MG tablet Take 1 tablet (4 mg total) by mouth every 6 (six) hours. 04/20/21   Curatolo, Adam, DO  oxyCODONE (ROXICODONE) 5 MG immediate release tablet  Take 1 tablet (5 mg total) by mouth every 6 (six) hours as needed for up to 15 doses for severe pain. 04/20/21   Curatolo, Adam, DO  tizanidine (ZANAFLEX) 2 MG capsule Take 1 capsule (2 mg total) by mouth 3 (three) times daily as needed for muscle spasms. 03/31/19   Mesner, Barbara Cower, MD  potassium chloride SA (K-DUR,KLOR-CON) 20 MEQ tablet Take 1 tablet (20 mEq total) by mouth daily. Patient not taking: Reported on 12/01/2018 11/28/18 03/31/19  Arthor Captain, PA-C    Allergies    Penicillins  Review of Systems   Review of Systems  Constitutional:  Negative for fever.  HENT:  Negative for sore throat.   Eyes:  Negative for visual disturbance.  Respiratory:  Positive for shortness of breath. Negative for cough.   Cardiovascular:  Positive for chest pain.  Gastrointestinal:  Positive for abdominal pain. Negative for hematemesis, hematochezia and vomiting.  Genitourinary:  Negative for hematuria, vaginal bleeding  and vaginal discharge.  Musculoskeletal:  Positive for back pain.  Skin:  Negative for rash.  Neurological:  Negative for headaches.   Physical Exam Updated Vital Signs BP (!) 125/97 (BP Location: Right Arm)   Pulse 72   Temp 98.9 F (37.2 C) (Oral)   Resp 16   Ht 5' (1.524 m)   Wt 77 kg   SpO2 100%   BMI 33.15 kg/m   Physical Exam Vitals and nursing note reviewed.  Constitutional:      General: She is not in acute distress.    Appearance: She is well-developed.  HENT:     Head: Normocephalic and atraumatic.  Eyes:     Conjunctiva/sclera: Conjunctivae normal.  Cardiovascular:     Rate and Rhythm: Normal rate and regular rhythm.     Heart sounds: No murmur heard. Pulmonary:     Effort: Pulmonary effort is normal. No respiratory distress.     Breath sounds: Normal breath sounds.  Abdominal:     Palpations: Abdomen is soft.     Tenderness: There is generalized abdominal tenderness and tenderness in the epigastric area. There is no guarding or rebound.  Musculoskeletal:        General: Tenderness (diffuse back tenderness) present. Normal range of motion.     Cervical back: Neck supple.     Right lower leg: No edema.     Left lower leg: No edema.  Skin:    General: Skin is warm and dry.  Neurological:     General: No focal deficit present.     Mental Status: She is alert.    ED Results / Procedures / Treatments   Labs (all labs ordered are listed, but only abnormal results are displayed) Labs Reviewed  COMPREHENSIVE METABOLIC PANEL - Abnormal; Notable for the following components:      Result Value   Total Bilirubin 0.2 (*)    Anion gap 4 (*)    All other components within normal limits  URINALYSIS, ROUTINE W REFLEX MICROSCOPIC - Abnormal; Notable for the following components:   APPearance CLOUDY (*)    Leukocytes,Ua LARGE (*)    Bacteria, UA FEW (*)    All other components within normal limits  RESP PANEL BY RT-PCR (FLU A&B, COVID) ARPGX2  LIPASE, BLOOD   CBC WITH DIFFERENTIAL/PLATELET  I-STAT BETA HCG BLOOD, ED ([facility], [facility], [facility] ONLY)    EKG EKG Interpretation  Date/Time:  Friday April 25 2021 09:36:34 EDT Ventricular Rate:  59 PR Interval:  182 QRS Duration: 86 QT Interval:  370 QTC Calculation: 366 R Axis:   70 Text Interpretation: Sinus bradycardia with sinus arrhythmia Otherwise normal ECG No significant change since prior 10/17 Confirmed by Meridee ScoreButler, Sanjana Folz [phone]([id number]) on 04/25/2021 9:38:51 AM  Radiology DG Chest Port 1 View  Result Date: 04/25/2021 CLINICAL DATA:  Chest pain and shortness of breath. EXAM: PORTABLE CHEST 1 VIEW COMPARISON:  01/11/2019 FINDINGS: The cardiomediastinal silhouette is within normal limits for portable AP technique. No airspace consolidation, edema, pleural effusion, or pneumothorax is identified. No acute osseous abnormality is seen. IMPRESSION: No active disease. Electronically Signed   By: Sebastian AcheAllen  Grady M.D.   On: 04/25/2021 09:33    Procedures Procedures   Medications Ordered in ED Medications  famotidine (PEPCID) IVPB 20 mg premix (0 mg Intravenous Stopped 04/25/21 1024)  fentaNYL (SUBLIMAZE) injection 50 mcg (50 mcg Intravenous Given 04/25/21 0954)  sodium chloride 0.9 % bolus 500 mL (0 mLs Intravenous Stopped 04/25/21 1115)    ED Course  I have reviewed the triage vital signs and the nursing notes.  Pertinent labs & imaging results that were available during my care of the patient were reviewed by me and considered in my medical decision making (see chart for details).  Clinical Course as of 04/25/21 1817  Caleen EssexFri Apr 25, 2021  [id number] Chest x-ray interpreted by me as no acute infiltrates.  Awaiting radiology reading. [MB]  1040 Reviewed lab work-up with patient.  Will place on PPI.  She understands her COVID is pending at time of discharge and can follow this up in MyChart.  She should isolate until results are back. [MB]    Clinical Course User Index [MB] Terrilee FilesButler, Alaisa Moffitt C, MD   MDM  Rules/Calculators/A&P                          Radene GunningCherry N Mcilwain was evaluated in Emergency Department on 04/25/2021 for the symptoms described in the history of present illness. She was evaluated in the context of the global COVID-19 pandemic, which necessitated consideration that the patient might be at risk for infection with the SARS-CoV-2 virus that causes COVID-19. Institutional protocols and algorithms that pertain to the evaluation of patients at risk for COVID-19 are in a state of rapid change based on information released by regulatory bodies including the CDC and federal and state organizations. These policies and algorithms were followed during the patient's care in the ED.  This patient complains of chest pain abdominal pain back pain; this involves an extensive number of treatment Options and is a complaint that carries with it a high risk of complications and Morbidity. The differential includes musculoskeletal pain, ACS, pneumonia, PE, COVID, gastritis, biliary colic, UTI  I ordered, reviewed and interpreted labs, which included CBC with normal white count normal hemoglobin, chemistries and LFTs normal, pregnancy test negative, COVID and flu testing negative, urinalysis likely contaminated 21-50 squamous cells I ordered medication IV fluids, IV Pepcid and pain medicine I ordered imaging studies which included chest x-ray and I independently    visualized and interpreted imaging which showed no acute findings Previous records obtained and reviewed in epic including prior ED visits recently.  After the interventions stated above, I reevaluated the patient and found patient to be hemodynamically stable and have benign physical exam.  Recommended that she establish care with a primary care doctor and her gynecologist.  Return instructions discussed   Final Clinical Impression(s) / ED Diagnoses Final diagnoses:  Generalized abdominal pain  Nonspecific chest pain  SOB (shortness of  breath)    Rx / DC Orders ED Discharge Orders          Ordered    omeprazole (PRILOSEC) 20 MG capsule  Daily        04/25/21 1042             Terrilee Files, MD 04/25/21 1819

## 2021-04-25 NOTE — Discharge Instructions (Addendum)
You were seen in the emergency department for various complaints including abdominal pain back pain chest pain shortness of breath.  You had lab work EKG and a chest x-ray that did not show an obvious explanation for your pain.  We are starting you on some acid medication.  Your COVID and flu test were negative.  Please try to establish care with a primary care doctor.  Return to the emergency department for any worsening or concerning symptoms.

## 2021-04-25 NOTE — ED Triage Notes (Signed)
Pt also c/o shortness of breath and occasional chest pain

## 2021-07-24 ENCOUNTER — Emergency Department (HOSPITAL_COMMUNITY)
Admission: EM | Admit: 2021-07-24 | Discharge: 2021-07-24 | Disposition: A | Discharge status: Home / Self Care | Payer: Medicaid Other | Attending: Emergency Medicine | Admitting: Emergency Medicine

## 2021-07-24 ENCOUNTER — Telehealth: Payer: Self-pay

## 2021-07-24 DIAGNOSIS — S4990XA Unspecified injury of shoulder and upper arm, unspecified arm, initial encounter: Secondary | ICD-10-CM | POA: Insufficient documentation

## 2021-07-24 DIAGNOSIS — Z5321 Procedure and treatment not carried out due to patient leaving prior to being seen by health care provider: Secondary | ICD-10-CM | POA: Insufficient documentation

## 2021-07-24 DIAGNOSIS — X58XXXA Exposure to other specified factors, initial encounter: Secondary | ICD-10-CM | POA: Insufficient documentation

## 2021-07-24 NOTE — ED Notes (Signed)
Patient called for triage, no answer x3 °

## 2021-07-27 ENCOUNTER — Emergency Department (HOSPITAL_COMMUNITY): Payer: Medicaid Other

## 2021-07-27 ENCOUNTER — Emergency Department (HOSPITAL_COMMUNITY)
Admission: EM | Admit: 2021-07-27 | Discharge: 2021-07-27 | Disposition: A | Discharge status: Home / Self Care | Payer: Medicaid Other | Attending: Emergency Medicine | Admitting: Emergency Medicine

## 2021-07-27 ENCOUNTER — Encounter (HOSPITAL_COMMUNITY): Payer: Self-pay | Admitting: Emergency Medicine

## 2021-07-27 DIAGNOSIS — Z79899 Other long term (current) drug therapy: Secondary | ICD-10-CM | POA: Insufficient documentation

## 2021-07-27 DIAGNOSIS — R102 Pelvic and perineal pain: Secondary | ICD-10-CM

## 2021-07-27 DIAGNOSIS — R1030 Lower abdominal pain, unspecified: Secondary | ICD-10-CM | POA: Diagnosis not present

## 2021-07-27 DIAGNOSIS — Z87891 Personal history of nicotine dependence: Secondary | ICD-10-CM | POA: Insufficient documentation

## 2021-07-27 DIAGNOSIS — I10 Essential (primary) hypertension: Secondary | ICD-10-CM | POA: Diagnosis not present

## 2021-07-27 DIAGNOSIS — L02211 Cutaneous abscess of abdominal wall: Secondary | ICD-10-CM

## 2021-07-27 LAB — COMPREHENSIVE METABOLIC PANEL
ALT: 12 U/L (ref 0–44)
AST: 18 U/L (ref 15–41)
Albumin: 3.7 g/dL (ref 3.5–5.0)
Alkaline Phosphatase: 43 U/L (ref 38–126)
Anion gap: 8 (ref 5–15)
BUN: 11 mg/dL (ref 6–20)
CO2: 27 mmol/L (ref 22–32)
Calcium: 9.1 mg/dL (ref 8.9–10.3)
Chloride: 104 mmol/L (ref 98–111)
Creatinine, Ser: 0.97 mg/dL (ref 0.44–1.00)
GFR, Estimated: >60 mL/min (ref >60)
Glucose, Bld: 88 mg/dL (ref 70–99)
Potassium: 4 mmol/L (ref 3.5–5.1)
Sodium: 139 mmol/L (ref 135–145)
Total Bilirubin: 0.5 mg/dL (ref 0.3–1.2)
Total Protein: 6.5 g/dL (ref 6.5–8.1)

## 2021-07-27 LAB — CBC WITH DIFFERENTIAL/PLATELET
Abs Immature Granulocytes: 0.01 10*3/uL (ref 0.00–0.07)
Basophils Absolute: 0 10*3/uL (ref 0.0–0.1)
Basophils Relative: 0 %
Eosinophils Absolute: 0.1 10*3/uL (ref 0.0–0.5)
Eosinophils Relative: 1 %
HCT: 39.1 % (ref 36.0–46.0)
Hemoglobin: 12.9 g/dL (ref 12.0–15.0)
Immature Granulocytes: 0 %
Lymphocytes Relative: 27 %
Lymphs Abs: 1.7 10*3/uL (ref 0.7–4.0)
MCH: 29.8 pg (ref 26.0–34.0)
MCHC: 33 g/dL (ref 30.0–36.0)
MCV: 90.3 fL (ref 80.0–100.0)
Monocytes Absolute: 0.4 10*3/uL (ref 0.1–1.0)
Monocytes Relative: 6 %
Neutro Abs: 4.2 10*3/uL (ref 1.7–7.7)
Neutrophils Relative %: 66 %
Platelets: 256 10*3/uL (ref 150–400)
RBC: 4.33 MIL/uL (ref 3.87–5.11)
RDW: 12 % (ref 11.5–15.5)
WBC: 6.4 10*3/uL (ref 4.0–10.5)
nRBC: 0 % (ref 0.0–0.2)

## 2021-07-27 LAB — LIPASE, BLOOD: Lipase: 41 U/L (ref 11–51)

## 2021-07-27 MED ORDER — OXYCODONE-ACETAMINOPHEN 5-325 MG PO TABS
1.0000 | ORAL_TABLET | Freq: Once | ORAL | Status: DC
  Filled 2021-07-27: qty 1

## 2021-07-27 MED ORDER — DOXYCYCLINE HYCLATE 100 MG PO CAPS: 100.0000 mg | ORAL_CAPSULE | Freq: Two times a day (BID) | ORAL | 0 refills | Status: DC

## 2021-07-27 NOTE — ED Notes (Signed)
States she is allergic to oxycodone and it makes her dizzy.

## 2021-07-27 NOTE — ED Provider Notes (Signed)
Emergency Medicine Provider Triage Evaluation Note  Christina Ingram , a 25 y.o. female  was evaluated in triage.  Pt complains of worsening abdominal pain since yesterday, known ovarian cyst. Also reports she feels a mass in her abdomen since yesterday. Pain 10/10. No n/v, vaginal bleeding, discharge, no diarrhea, constipation, no heavy drinking. Patient denies sexual activity, reports she cannot be pregnant.  Review of Systems  Positive: Abdominal pain, pelvic pain Negative: Vaginal bleeding, N/V/D  Physical Exam  BP 111/85 (BP Location: Right Arm)   Pulse 81   Temp (!) 97.4 F (36.3 C) (Oral)   Resp 15   SpO2 100%  Gen:   Awake, crying, moaning, in distress Resp:  Normal effort  MSK:   Moves extremities without difficulty  Other:  TTP throughout abdomen, worsen in lower quadrants. There is a small pore without clear cyst located on the lower abdomen, no redness or purulent discharge  Medical Decision Making  Medically screening exam initiated at 10:50 AM.  Appropriate orders placed.  Christina Ingram was informed that the remainder of the evaluation will be completed by another provider, this initial triage assessment does not replace that evaluation, and the importance of remaining in the ED until their evaluation is complete.  Abdominal, pelvic pain   Olene Floss, PA-C 07/27/21 1054    Derwood Kaplan, MD 07/29/21 1731

## 2021-07-27 NOTE — ED Triage Notes (Addendum)
Pt reports abd pain since yesterday with a known ovarian cyst.  Stats she feels a mass in her abd.  Denies nausea, vomiting, diarrhea, or constipation.  Moaning in pain.

## 2021-07-27 NOTE — ED Provider Notes (Signed)
[facility] EMERGENCY DEPARTMENT Provider Note   CSN: [id number] Arrival date & time: 07/27/21  1031     History Chief Complaint  Patient presents with   Abdominal Pain    Christina Ingram is a 25 y.o. female.  Pt reports she has a history of ovarian cyst.  Pt complains of lower abdominal soreness.  Pt reports she has a swollen area lower abdomen   The history is provided by the patient. No language interpreter was used.  Abdominal Pain Pain quality: aching   Pain radiates to:  Does not radiate Pain severity:  Moderate Timing:  Constant Progression:  Worsening Chronicity:  New Relieved by:  Nothing Worsened by:  Nothing Ineffective treatments:  None tried Associated symptoms: no vaginal bleeding   Pt has nexplanon.  Pt not concerned ab out pregnancy.  Pt denies std risk.  Pt complains of a swollen are that came up on lower abdomen.  No fever no chills.      Past Medical History:  Diagnosis Date   Hypertension     Patient Active Problem List   Diagnosis Date Noted   Nexplanon insertion 06/18/2015   Postpartum depression 05/28/2015   IUGR (intrauterine growth restriction) 04/21/2015    Past Surgical History:  Procedure Laterality Date   WISDOM TOOTH EXTRACTION       OB History     Gravida  1   Para  1   Term  1   Preterm      AB      Living  1      SAB      IAB      Ectopic      Multiple  0   Live Births  1           Family History  Problem Relation Age of Onset   Cancer Mother    Clotting disorder Mother    Venous thrombosis Mother    Hypertension Mother    Diabetes Maternal Grandmother    Hypertension Maternal Grandmother    Diabetes Maternal Grandfather     Social History   Tobacco Use   Smoking status: Former    Packs/day: 0.25    Types: Cigarettes   Smokeless tobacco: Never  Vaping Use   Vaping Use: Never used  Substance Use Topics   Alcohol use: No   Drug use: No    Home  Medications Prior to Admission medications   Medication Sig Start Date End Date Taking? Authorizing Provider  doxycycline (VIBRAMYCIN) 100 MG capsule Take 1 capsule (100 mg total) by mouth 2 (two) times daily. 01/11/19   Jacalyn Lefevre, MD  ibuprofen (ADVIL) 800 MG tablet Take 1 tablet (800 mg total) by mouth 3 (three) times daily. 03/31/19   Mesner, Barbara Cower, MD  loratadine (CLARITIN) 10 MG tablet Take 1 tablet (10 mg total) by mouth daily. 01/11/19   Jacalyn Lefevre, MD  omeprazole (PRILOSEC) 20 MG capsule Take 1 capsule (20 mg total) by mouth daily. 04/25/21   Terrilee Files, MD  ondansetron (ZOFRAN) 4 MG tablet Take 1 tablet (4 mg total) by mouth every 6 (six) hours. 04/20/21   Curatolo, Adam, DO  oxyCODONE (ROXICODONE) 5 MG immediate release tablet Take 1 tablet (5 mg total) by mouth every 6 (six) hours as needed for up to 15 doses for severe pain. 04/20/21   Curatolo, Adam, DO  tizanidine (ZANAFLEX) 2 MG capsule Take 1 capsule (2 mg total) by mouth 3 (three) times daily  as needed for muscle spasms. 03/31/19   Mesner, Barbara Cower, MD  potassium chloride SA (K-DUR,KLOR-CON) 20 MEQ tablet Take 1 tablet (20 mEq total) by mouth daily. Patient not taking: Reported on 12/01/2018 11/28/18 03/31/19  Arthor Captain, PA-C    Allergies    Oxycodone and Penicillins  Review of Systems   Review of Systems  Gastrointestinal:  Positive for abdominal pain.  Genitourinary:  Negative for vaginal bleeding.  All other systems reviewed and are negative.  Physical Exam Updated Vital Signs BP 109/71   Pulse 60   Temp (!) 97.4 F (36.3 C) (Oral)   Resp 15   SpO2 100%   Physical Exam Vitals and nursing note reviewed.  Constitutional:      Appearance: She is well-developed.  HENT:     Head: Normocephalic.  Pulmonary:     Effort: Pulmonary effort is normal.  Abdominal:     General: Bowel sounds are normal. There is no distension.     Palpations: Abdomen is soft.     Hernia: There is no hernia in the umbilical area  or ventral area.  Musculoskeletal:        General: Normal range of motion.     Cervical back: Normal range of motion.  Skin:    Comments: 1cm area of erythema mid suprapubic area,    Neurological:     Mental Status: She is alert and oriented to person, place, and time.    ED Results / Procedures / Treatments   Labs (all labs ordered are listed, but only abnormal results are displayed) Labs Reviewed  CBC WITH DIFFERENTIAL/PLATELET  COMPREHENSIVE METABOLIC PANEL  LIPASE, BLOOD  URINALYSIS, ROUTINE W REFLEX MICROSCOPIC  POC URINE PREG, ED    EKG None  Radiology US PELVIC COMPLETE W TRANSVAGINAL AND TORSION R/O  Result Date: 07/27/2021 CLINICAL DATA:  Right-sided pelvic pain for 1 day. EXAM: TRANSABDOMINAL AND TRANSVAGINAL ULTRASOUND OF PELVIS DOPPLER ULTRASOUND OF OVARIES TECHNIQUE: Both transabdominal and transvaginal ultrasound examinations of the pelvis were performed. Transabdominal technique was performed for global imaging of the pelvis including uterus, ovaries, adnexal regions, and pelvic cul-de-sac. It was necessary to proceed with endovaginal exam following the transabdominal exam to visualize the ovaries and endometrium. Color and duplex Doppler ultrasound was utilized to evaluate blood flow to the ovaries. COMPARISON:  None. FINDINGS: Uterus Measurements: 9.1 x 4.6 x 5.5 cm = volume: 118.5 mL. No fibroids or other mass visualized. Endometrium Thickness: 12 mm.  No focal abnormality visualized. Right ovary Measurements: 3.3 x 1.7 x 1.8 cm = volume: 5.3 mL. Normal appearance/no adnexal mass. Left ovary Measurements: 2.8 x 2.1 x 2.0 cm = volume: 6.22 mL. Normal appearance/no adnexal mass. Pulsed Doppler examination of both ovaries demonstrates normal venous and arterial blood flow. Other findings No abnormal free fluid. IMPRESSION: Normal pelvic ultrasound examination. Electronically Signed   By: Rudie Meyer M.D.   On: 07/27/2021 12:35    Procedures Procedures   Medications  Ordered in ED Medications  oxyCODONE-acetaminophen (PERCOCET/ROXICET) 5-325 MG per tablet 1 tablet (1 tablet Oral Not Given 07/27/21 1103)    ED Course  I have reviewed the triage vital signs and the nursing notes.  Pertinent labs & imaging results that were available during my care of the patient were reviewed by me and considered in my medical decision making (see chart for details).    MDM Rules/Calculators/A&P  MDM:  Pt has a small pimple/early abscess suprapubic area.  Pt has had labs that are normal,  ultrasound is normal.  No ovarian cyst  no torsion.  Pt counseled on skin infection.  Pt advised warm compresses.  Rx for doxycycline.  Pt advised to recheck if increased redness or swelling.   Pt in ED for several hours.  Pt states she forgot to get urine sample when she went to restroom.  Pt denies any uti complaints or pregnancy.   Final Clinical Impression(s) / ED Diagnoses Final diagnoses:  Abdominal pain, suprapubic    Rx / DC Orders ED Discharge Orders          Ordered    doxycycline (VIBRAMYCIN) 100 MG capsule  2 times daily        07/27/21 1427          An After Visit Summary was printed and given to the patient.    Elson Areas, PA-C 07/27/21 1428    Gloris Manchester, MD 07/27/21 [phone]

## 2021-12-02 ENCOUNTER — Other Ambulatory Visit: Payer: Self-pay

## 2021-12-02 ENCOUNTER — Encounter (HOSPITAL_COMMUNITY): Payer: Self-pay

## 2021-12-02 ENCOUNTER — Emergency Department (HOSPITAL_COMMUNITY)
Admission: EM | Admit: 2021-12-02 | Discharge: 2021-12-02 | Disposition: A | Discharge status: Home / Self Care | Payer: Medicaid Other | Attending: Emergency Medicine | Admitting: Emergency Medicine

## 2021-12-02 DIAGNOSIS — Z3202 Encounter for pregnancy test, result negative: Secondary | ICD-10-CM | POA: Insufficient documentation

## 2021-12-02 LAB — POC URINE PREG, ED: Preg Test, Ur: NEGATIVE

## 2021-12-02 NOTE — ED Notes (Signed)
Called x 3 NO answer 

## 2021-12-02 NOTE — ED Provider Notes (Signed)
?[facility] EMERGENCY DEPARTMENT ?Provider Note ? ? ?CSN: [id number] ?Arrival date & time: 12/02/21  0825 ? ?  ? ?History ? ?Chief Complaint  ?Patient presents with  ? Possible Pregnancy  ? ? ?Christina Ingram is a 26 y.o. female.  She is here with concerns for being pregnant.  She said she can feel something moving under her ribs and pushing on her cervix.  Her breasts have been tender and she has had some morning sickness.  Her last menstrual period was 2 weeks ago.  No fevers or chills.  No diarrhea or constipation. ? ?The history is provided by the patient.  ?Possible Pregnancy ?This is a new problem. The current episode started more than 1 week ago. The problem occurs constantly. The problem has not changed since onset.Pertinent negatives include no chest pain, no abdominal pain, no headaches and no shortness of breath. Nothing aggravates the symptoms. Nothing relieves the symptoms. She has tried nothing for the symptoms. The treatment provided no relief.  ? ?  ? ?Home Medications ?Prior to Admission medications   ?Medication Sig Start Date End Date Taking? Authorizing Provider  ?doxycycline (VIBRAMYCIN) 100 MG capsule Take 1 capsule (100 mg total) by mouth 2 (two) times daily. 07/27/21   Elson Areas, PA-C  ?ibuprofen (ADVIL) 800 MG tablet Take 1 tablet (800 mg total) by mouth 3 (three) times daily. 03/31/19   Mesner, Barbara Cower, MD  ?loratadine (CLARITIN) 10 MG tablet Take 1 tablet (10 mg total) by mouth daily. 01/11/19   Jacalyn Lefevre, MD  ?omeprazole (PRILOSEC) 20 MG capsule Take 1 capsule (20 mg total) by mouth daily. 04/25/21   Terrilee Files, MD  ?ondansetron (ZOFRAN) 4 MG tablet Take 1 tablet (4 mg total) by mouth every 6 (six) hours. 04/20/21   Curatolo, Adam, DO  ?oxyCODONE (ROXICODONE) 5 MG immediate release tablet Take 1 tablet (5 mg total) by mouth every 6 (six) hours as needed for up to 15 doses for severe pain. 04/20/21   Curatolo, Adam, DO  ?tizanidine (ZANAFLEX) 2 MG capsule Take  1 capsule (2 mg total) by mouth 3 (three) times daily as needed for muscle spasms. 03/31/19   Mesner, Barbara Cower, MD  ?potassium chloride SA (K-DUR,KLOR-CON) 20 MEQ tablet Take 1 tablet (20 mEq total) by mouth daily. ?Patient not taking: Reported on 12/01/2018 11/28/18 03/31/19  Arthor Captain, PA-C  ?   ? ?Allergies    ?Oxycodone and Penicillins   ? ?Review of Systems   ?Review of Systems  ?Constitutional:  Negative for fever.  ?HENT:  Negative for sore throat.   ?Eyes:  Negative for visual disturbance.  ?Respiratory:  Negative for shortness of breath.   ?Cardiovascular:  Negative for chest pain.  ?Gastrointestinal:  Positive for nausea. Negative for abdominal pain and vomiting.  ?Genitourinary:  Negative for dysuria.  ?Musculoskeletal:  Positive for back pain.  ?Skin:  Negative for rash.  ?Neurological:  Negative for headaches.  ? ?Physical Exam ?Updated Vital Signs ?BP 126/76 (BP Location: Left Arm)   Pulse 83   Temp 98.3 ?F (36.8 ?C) (Oral)   Resp 16   Ht 5\' 1"  (1.549 m)   Wt 89.8 kg   LMP 11/25/2021   SpO2 99%   BMI 37.41 kg/m?  ?Physical Exam ?Vitals and nursing note reviewed.  ?Constitutional:   ?   General: She is not in acute distress. ?   Appearance: Normal appearance. She is well-developed.  ?HENT:  ?   Head: Normocephalic and atraumatic.  ?  Eyes:  ?   Conjunctiva/sclera: Conjunctivae normal.  ?Cardiovascular:  ?   Rate and Rhythm: Normal rate and regular rhythm.  ?   Heart sounds: No murmur heard. ?Pulmonary:  ?   Effort: Pulmonary effort is normal. No respiratory distress.  ?   Breath sounds: Normal breath sounds.  ?Abdominal:  ?   Palpations: Abdomen is soft.  ?   Tenderness: There is no abdominal tenderness. There is no guarding or rebound.  ?Musculoskeletal:     ?   General: No swelling. Normal range of motion.  ?   Cervical back: Neck supple.  ?   Right lower leg: No edema.  ?   Left lower leg: No edema.  ?Skin: ?   General: Skin is warm and dry.  ?   Capillary Refill: Capillary refill takes less than 2  seconds.  ?Neurological:  ?   General: No focal deficit present.  ?   Mental Status: She is alert.  ? ? ?ED Results / Procedures / Treatments   ?Labs ?(all labs ordered are listed, but only abnormal results are displayed) ?Labs Reviewed  ?POC URINE PREG, ED  ? ? ?EKG ?None ? ?Radiology ?No results found. ? ?Procedures ?Ultrasound ED FAST ? ?Date/Time: 12/03/2021 9:37 AM ?Performed by: Terrilee Files, MD ?Authorized by: Terrilee Files, MD  ?Procedure details:   ?  Indications comment:  Abdominal distention  ?  Assess for:  Intra-abdominal fluid  ?  Technique:  Abdominal  ?  Images: not archived  ?   ? ?Abdominal findings:  ?  L kidney:  Visualized ?  R kidney:  Visualized ?  Liver:  Visualized ? ?  Bladder:  Visualized ?  Hepatorenal space visualized: identified   ?  Splenorenal space: identified   ?  Rectovesical free fluid: identified   ?  Splenorenal free fluid: not identified   ?  Hepatorenal space free fluid: not identified    ? ? ?Medications Ordered in ED ?Medications - No data to display ? ?ED Course/ Medical Decision Making/ A&P ?  ?                        ?Medical Decision Making ? ?26 year old female here for evaluation of possible pregnancy.  She has various complaints of feeling baby moving in her abdomen, breast tender, morning sickness.  Pregnancy test negative.  I did a bedside ultrasound and reviewed with patient no evidence of any identified pregnancy.  There was no free fluid.  Patient given contact information for OB/GYN follow-up ? ? ? ? ? ? ? ?Final Clinical Impression(s) / ED Diagnoses ?Final diagnoses:  ?Encounter for pregnancy test with result negative  ? ? ?Rx / DC Orders ?ED Discharge Orders   ? ? None  ? ?  ? ? ?  ?Terrilee Files, MD ?12/03/21 [phone] ? ?

## 2021-12-02 NOTE — ED Triage Notes (Signed)
Pt requesting pregnancy test, LMP 2/28, states she took multiple tests at home and had positive and negative tests.  ?

## 2022-02-04 ENCOUNTER — Encounter: Payer: Self-pay | Admitting: Certified Nurse Midwife

## 2022-02-04 ENCOUNTER — Ambulatory Visit (INDEPENDENT_AMBULATORY_CARE_PROVIDER_SITE_OTHER): Payer: Medicaid Other | Admitting: Certified Nurse Midwife

## 2022-02-04 ENCOUNTER — Other Ambulatory Visit (HOSPITAL_COMMUNITY)
Admission: RE | Admit: 2022-02-04 | Discharge: 2022-02-04 | Disposition: A | Discharge status: Home / Self Care | Payer: Medicaid Other | Source: Ambulatory Visit | Attending: Certified Nurse Midwife | Admitting: Certified Nurse Midwife

## 2022-02-04 VITALS — BP 116/87 | HR 82 | Wt 196.6 lb

## 2022-02-04 DIAGNOSIS — Z01419 Encounter for gynecological examination (general) (routine) without abnormal findings: Secondary | ICD-10-CM | POA: Diagnosis not present

## 2022-02-04 DIAGNOSIS — Z3169 Encounter for other general counseling and advice on procreation: Secondary | ICD-10-CM

## 2022-02-04 DIAGNOSIS — Z1331 Encounter for screening for depression: Secondary | ICD-10-CM

## 2022-02-04 NOTE — Progress Notes (Signed)
Patient is concerned the hospital is trying to hide something. She states she states she asked questions at the hospital and was not given answers. States all she rememebers is that they told her she has a cyst on ovary.  ? ?Reports intermittent pain that feels like childbirth and abdominal bloating. Reports irregular bleeding. Not using contraception. Desires pregnancy.  ? ?Fleet Contras RN  ?02/04/22 ? ? ?

## 2022-02-05 LAB — CBC
Hematocrit: 38.8 % (ref 34.0–46.6)
Hemoglobin: 12.5 g/dL (ref 11.1–15.9)
MCH: 28.2 pg (ref 26.6–33.0)
MCHC: 32.2 g/dL (ref 31.5–35.7)
MCV: 87 fL (ref 79–97)
Platelets: 274 10*3/uL (ref 150–450)
RBC: 4.44 x10E6/uL (ref 3.77–5.28)
RDW: 12 % (ref 11.7–15.4)
WBC: 6 10*3/uL (ref 3.4–10.8)

## 2022-02-05 LAB — HEMOGLOBIN A1C
Est. average glucose Bld gHb Est-mCnc: 120 mg/dL
Hgb A1c MFr Bld: 5.8 % — ABNORMAL HIGH (ref 4.8–5.6)

## 2022-02-05 LAB — VITAMIN D 25 HYDROXY (VIT D DEFICIENCY, FRACTURES): Vit D, 25-Hydroxy: 26.5 ng/mL — ABNORMAL LOW (ref 30.0–100.0)

## 2022-02-07 NOTE — Progress Notes (Signed)
History:  ?Christina Ingram is a 26 y.o. G1P1001 who presents to clinic today for preconception counseling and questions about her fertility. She has been trying to get pregnant, presents information about cyst as if it were recent like her visit for suspected pregnancy. Has a regular cycle and does not understand why she "feels pregnant" but will then get her cycle. Otherwise doing well. ? ?The following portions of the patient's history were reviewed and updated as appropriate: allergies, current medications, family history, past medical history, social history, past surgical history and problem list. ? ?Review of Systems:  ?Pertinent items noted in HPI and remainder of comprehensive ROS otherwise negative.  ?  ?Objective:  ?Physical Exam ?BP 116/87   Pulse 82   Wt 196 lb 9.6 oz (89.2 kg)   BMI 37.15 kg/m?  ?Physical Exam ?Vitals and nursing note reviewed.  ?Constitutional:   ?   Appearance: Normal appearance.  ?HENT:  ?   Head: Normocephalic and atraumatic.  ?   Mouth/Throat:  ?   Mouth: Mucous membranes are moist.  ?Eyes:  ?   Pupils: Pupils are equal, round, and reactive to light.  ?Cardiovascular:  ?   Rate and Rhythm: Normal rate and regular rhythm.  ?Pulmonary:  ?   Effort: Pulmonary effort is normal.  ?Abdominal:  ?   General: There is no distension.  ?   Palpations: Abdomen is soft.  ?   Tenderness: There is no abdominal tenderness.  ?Genitourinary: ?   General: Normal vulva.  ?Musculoskeletal:     ?   General: Normal range of motion.  ?Skin: ?   General: Skin is warm and dry.  ?   Capillary Refill: Capillary refill takes less than 2 seconds.  ?Neurological:  ?   Mental Status: She is alert and oriented to person, place, and time.  ?Psychiatric:     ?   Mood and Affect: Mood normal.     ?   Behavior: Behavior normal.     ?   Thought Content: Thought content normal.  ? ? ?Labs and Imaging ?Reviewed results of pelvic ultrasound done in Oct 2022, explained in detail luteal cysts, how (if large enough)  they can delay period onset, hemorrhagic cysts, and why we expect them to resolve on their own. Assured her the presence of a cyst in Oct 2022 and absence of it in March 2023 is completely normal. ? ?Assessment & Plan:  ?1. Women's annual routine gynecological examination ?- Cytology - PAP( [facility]) ? ?2. Encounter for preconception consultation ?- CBC ?- Hemoglobin A1c ?- Vitamin D (25 hydroxy) ? ?3. Positive depression screening ?- Ambulatory referral to Integrated Behavioral Health ? ? ?Edd Arbour R, CNM ?02/07/2022 ?10:19 AM ? ?

## 2022-02-09 LAB — CYTOLOGY - PAP: Diagnosis: NEGATIVE
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
## 2022-02-11 ENCOUNTER — Ambulatory Visit (HOSPITAL_COMMUNITY)
Admission: EM | Admit: 2022-02-11 | Discharge: 2022-02-11 | Disposition: A | Discharge status: Home / Self Care | Payer: Medicaid Other | Attending: Psychiatry | Admitting: Psychiatry

## 2022-02-11 ENCOUNTER — Ambulatory Visit (INDEPENDENT_AMBULATORY_CARE_PROVIDER_SITE_OTHER): Payer: Medicaid Other | Admitting: Clinical

## 2022-02-11 DIAGNOSIS — Z91410 Personal history of adult physical and sexual abuse: Secondary | ICD-10-CM | POA: Insufficient documentation

## 2022-02-11 DIAGNOSIS — F39 Unspecified mood [affective] disorder: Secondary | ICD-10-CM

## 2022-02-11 DIAGNOSIS — F32A Depression, unspecified: Secondary | ICD-10-CM | POA: Insufficient documentation

## 2022-02-11 NOTE — Patient Instructions (Addendum)
[facility] [address] [place], [place] [address] [phone] (main office) [phone] (Atif Chapple's office)  [facility]  [address], [place], [place] [address] [phone] or [phone] WALK-IN URGENT CARE 24/7 FOR ANYONE [address], [place], [place]  [phone] Fax: [phone] [email or web address] *Interpreters available *Accepts all insurance and uninsured for Urgent Care needs *Accepts Medicaid and uninsured for outpatient treatment     ONLY FOR [place] Residents  New patient assessment and therapy walk-ins Mondays and Wednesdays 8am-11am First and second Fridays 1pm-5pm  New patient psychiatry and medication management walk-ins:  Mondays, Wednesdays, Thursdays, Fridays 8am -11am NO PSYCHIATRY WALK-INS on TUESDAYS  [facility] primary care offices possibly accepting new patients:   [facility] [place] [address] [place], [place] [address] [phone]  [facility] [address] [place], [place] [address] [phone]  [facility] [address] [place], [place] [address] [phone]  [facility] [address] [place], [place] [address] [phone]  [facility] [address] [place],  [place]  [address] [phone]    
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
## 2022-02-11 NOTE — ED Triage Notes (Signed)
Pt presents to [facility] with a complaint of worsening depression symptoms. Pt states that she is overworked by her spouse and she doesn't have any assistance with her child, which leads to insomnia. Pt states that she has not  had a full nights rest in several months. Pt appears to be tired during triage. Pt states that she is experiencing passive SI at times and crying spells. Pt states she was diagnosed with depression in the past but has not taken medication since 2016. Pt states when she doesn't get enough sleep she sometimes has visual hallucinations of "seeing stuff crawling up the wall". Pt states " I just really need to get some sleep".Pt denies SI/HI and AVH at this time. ?

## 2022-02-11 NOTE — Discharge Instructions (Addendum)
Please come to [facility] (this facility, SECOND floor) during walk in hours for appointment with psychiatrist/provider for further medication management and for therapists for therapy.  ? ?Walk-Ins for medication management  are available on Monday, Wednesday, Thursday and Friday from 8am-11am.  It is first come, first -serve; it is best to arrive by 7:00 AM.  ? ?Walk-Ins for therapy are available on Monday and Wednesday?s  8am-11am.  It is first come, first -serve; it is best to arrive by 7:00 AM.  ? ?When you arrive please go upstairs for your appointment. If you are unsure of where to go, inform the front desk that you are here for a walk in appointment and they will assist you with directions upstairs. ? ?Address:  ?[address], in [place], [address] ?Ph: [phone]  ?

## 2022-02-12 NOTE — BH Specialist Note (Signed)
Integrated Behavioral Health Follow Up In-Person Visit  MRN: [id number] Name: Christina Ingram  Number of [facility] Clinician visits: 2- Second Visit  Session Start time: [phone]   Session End time: [id number]  Total time in minutes: 70   Types of Service: Individual psychotherapy  Interpretor:No. Interpretor Name and Language: n/a  Subjective: Christina Ingram is a 26 y.o. female accompanied by  n/a Patient was referred by Gaylan Gerold, CNM for positive depression screen. Patient reports the following symptoms/concerns: Feeling depressed, misunderstood, unheard; feels at peace when she is able to feel the warmth of the sun, the earth beneath her feet; nourishing her body with plants from the earth, with time and space to remember her protective ancestors, especially her grandfather; keeping dream journal to process dream life. Pt's goal is to move into her own housing, where she can feel at peace.  Duration of problem: Ongoing; Severity of problem:  moderately severe  Objective: Mood: Depressed and Affect: Appropriate and Tearful Risk of harm to self or others: No plan to harm self or others  Life Context: Family and Social: Pt lives with her husband and daughter School/Work: Unemployed currently Self-Care: Medical laboratory scientific officer Life Changes: Her mother's cancer diagnosis; conflict with husband  Patient and/or Family's Strengths/Protective Factors: Concrete supports in place (healthy food, safe environments, etc.) and Sense of purpose  Goals Addressed: Patient will:  Reduce symptoms of: anxiety, depression, and stress   Increase knowledge and/or ability of: stress reduction   Demonstrate ability to: Increase motivation to adhere to plan of care  Progress towards Goals: Ongoing  Interventions: Interventions utilized:  Supportive Reflection Standardized Assessments completed: GAD-7 and PHQ 9  Patient and/or Family Response: Patient agrees with treatment  plan.   Patient Centered Plan: Patient is on the following Treatment Plan(s): IBH Assessment: Patient currently experiencing Mood disorder, unspecified and Psychosocial stress.   Patient may benefit from continued therapeutic interventions.  Plan: Follow up with behavioral health clinician on : Two weeks Behavioral recommendations:  -Continue keeping dream journal to record the vivid dreams. Remember the dream world is not reality, but the symbolic processing of the unconscious mind -Continue plan to establish care at [facility] today before leaving  -Continue spending time outdoors daily on sunny days; consider starting a small garden  -Continue reading "How the Body Keeps the Score" Referral(s): [place] (In Clinic) and Community Resources:  Food  Caroleen Hamman [place], [place]     02/25/2022    1:01 PM 02/11/2022   12:30 PM 02/04/2022   10:37 AM  Depression screen PHQ 2/9  Decreased Interest 0 3 0  Down, Depressed, Hopeless 1 2 2   PHQ - 2 Score 1 5 2   Altered sleeping 2 2 2   Tired, decreased energy 1 0 2  Change in appetite 1 2 1   Feeling bad or failure about yourself  1 0 3  Trouble concentrating 1 1 3   Moving slowly or fidgety/restless 0 1 3  Suicidal thoughts 0 0 0  PHQ-9 Score 7 11 16       02/25/2022    1:02 PM 02/11/2022   12:38 PM 02/04/2022   10:50 AM  GAD 7 : Generalized Anxiety Score  Nervous, Anxious, on Edge 1 2 1   Control/stop worrying 1 2 2   Worry too much - different things 1 2 2   Trouble relaxing 1 2 2   Restless 1 2 1   Easily annoyed or irritable 0 0 1  Afraid - awful might  happen 1 0 0  Total GAD 7 Score 6 10 9

## 2022-02-25 ENCOUNTER — Ambulatory Visit (INDEPENDENT_AMBULATORY_CARE_PROVIDER_SITE_OTHER): Payer: Medicaid Other | Admitting: Clinical

## 2022-02-25 DIAGNOSIS — Z658 Other specified problems related to psychosocial circumstances: Secondary | ICD-10-CM

## 2022-02-25 DIAGNOSIS — F39 Unspecified mood [affective] disorder: Secondary | ICD-10-CM | POA: Diagnosis not present

## 2022-02-26 ENCOUNTER — Telehealth: Payer: Self-pay

## 2022-02-26 NOTE — BH Specialist Note (Deleted)
Integrated Behavioral Health Follow Up In-Person Visit  MRN: [id number] Name: CONCHA SUDOL  Number of Integrated Behavioral Health Clinician visits: 2- Second Visit  Session Start time: [phone]   Session End time: 1036  Total time in minutes: 70   Types of Service: {CHL AMB TYPE OF [phone]}  Interpretor:{yes [id number]} Interpretor Name and Language: ***  Subjective: EMMANUEL ERCOLE is a 26 y.o. female accompanied by {Patient accompanied [phone]} Patient was referred by *** for ***. Patient reports the following symptoms/concerns: *** Duration of problem: ***; Severity of problem: {Mild/Moderate/Severe:20260}  Objective: Mood: {BHH MOOD:22306} and Affect: {BHH AFFECT:22307} Risk of harm to self or others: {CHL AMB BH Suicide Current Mental Status:21022748}  Life Context: Family and Social: *** School/Work: *** Self-Care: *** Life Changes: ***  Patient and/or Family's Strengths/Protective Factors: {CHL AMB BH PROTECTIVE [phone]}  Goals Addressed: Patient will:  Reduce symptoms of: {IBH Symptoms:21014056}   Increase knowledge and/or ability of: {IBH Patient Tools:21014057}   Demonstrate ability to: {IBH Goals:21014053}  Progress towards Goals: {CHL AMB BH PROGRESS TOWARDS [phone]}  Interventions: Interventions utilized:  {IBH Interventions:21014054} Standardized Assessments completed: {IBH Screening Tools:21014051}  Patient and/or Family Response: ***  Patient Centered Plan: Patient is on the following Treatment Plan(s): *** Assessment: Patient currently experiencing ***.   Patient may benefit from ***.  Plan: Follow up with behavioral health clinician on : *** Behavioral recommendations: *** Referral(s): {IBH Referrals:21014055} "From scale of 1-10, how likely are you to follow plan?": ***  Valetta Close Aliyah Abeyta, LCSW

## 2022-02-26 NOTE — Telephone Encounter (Signed)
Called pt to review results from recent visit. Explained PAP is WNL and will need repeat in 3 years. Vitamin D is mildly low; Jamilla, CNM recommends OTC vitamin D if desired. A1C is 5.8; CNM recommends follow up with PCP. Reminded pt of upcoming appt with Family Medicine at [facility]. Pt reports pain with intercourse, is concerned this is related to possible cancer due to family history of vaginal history. Pt desires appt to discuss with provider. Front office notified to schedule appt.

## 2022-03-04 ENCOUNTER — Telehealth: Payer: Self-pay | Admitting: Family Medicine

## 2022-03-04 NOTE — Telephone Encounter (Signed)
Patient was called to set appt for GYN Pain w/ intercourse.

## 2022-03-11 ENCOUNTER — Ambulatory Visit: Payer: Medicaid Other

## 2022-04-29 ENCOUNTER — Ambulatory Visit: Payer: Medicaid Other | Admitting: Family Medicine

## 2022-07-15 ENCOUNTER — Ambulatory Visit (HOSPITAL_COMMUNITY): Payer: Medicaid Other | Admitting: Student

## 2022-07-15 DIAGNOSIS — F53 Postpartum depression: Secondary | ICD-10-CM

## 2022-07-15 DIAGNOSIS — F17200 Nicotine dependence, unspecified, uncomplicated: Secondary | ICD-10-CM | POA: Insufficient documentation

## 2022-11-04 IMAGING — US US PELVIS COMPLETE
1 series · 13 of 25 positions shown · non-contrast
Comparison: 03/17/2017.

CLINICAL DATA: Right lower quadrant pain. History of ovarian cyst.
Pain worsened today.

EXAM:
TRANSABDOMINAL ULTRASOUND OF PELVIS
DOPPLER ULTRASOUND OF OVARIES
TECHNIQUE: Transabdominal ultrasound examination of the pelvis was performed
including evaluation of the uterus, ovaries, adnexal regions, and
pelvic cul-de-sac.
Color and duplex Doppler ultrasound was utilized to evaluate blood
flow to the ovaries.

[Series 1: us pelvis (transabdominal only) · 13 of 56 slices shown]
[im 1/56]
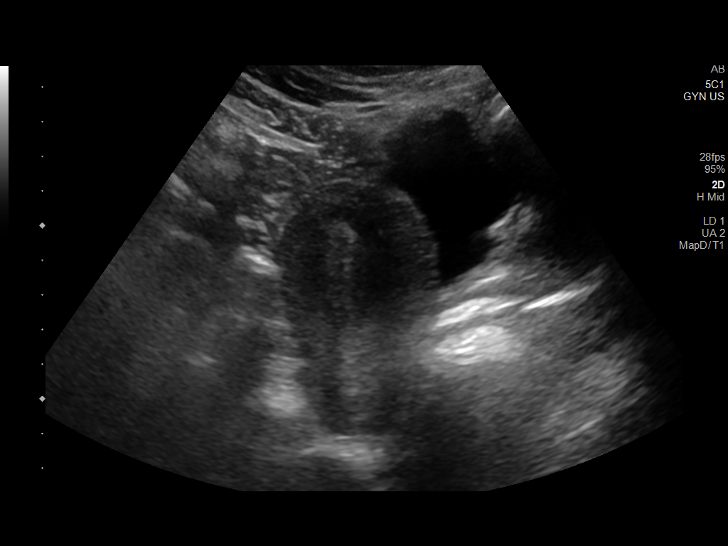
[im 5/56]
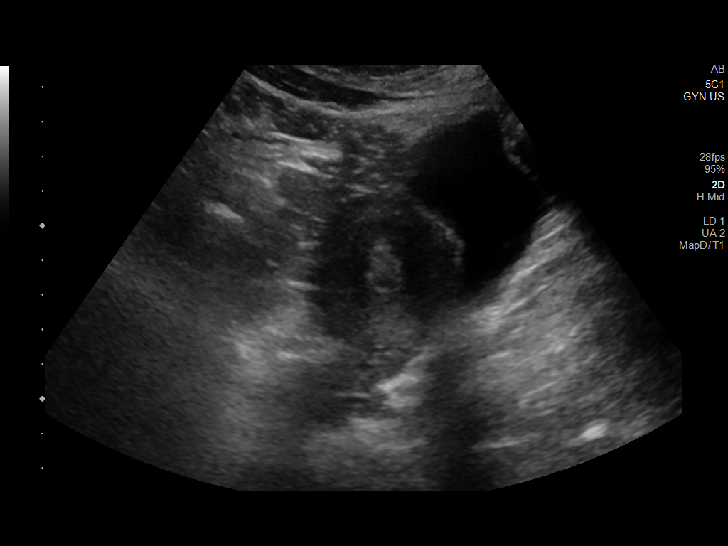
[im 10/56]
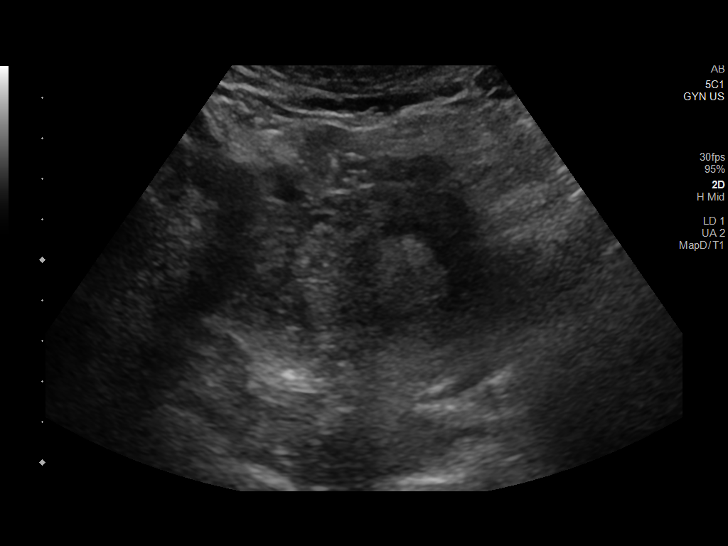
[im 14/56]
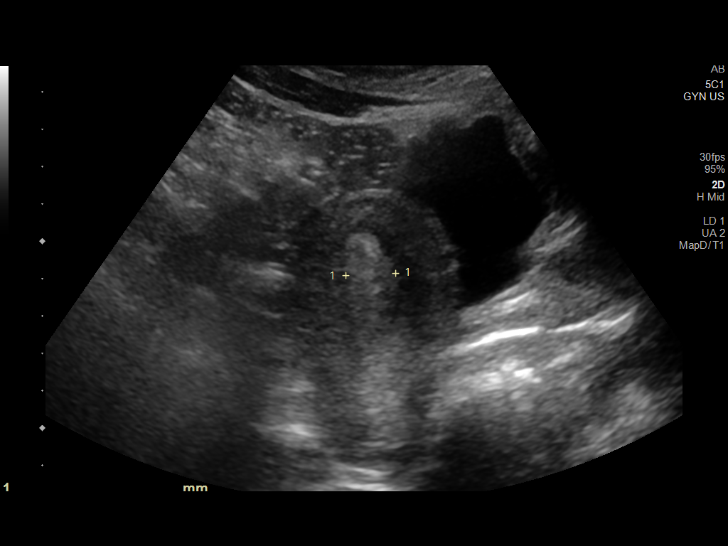
[im 19/56]
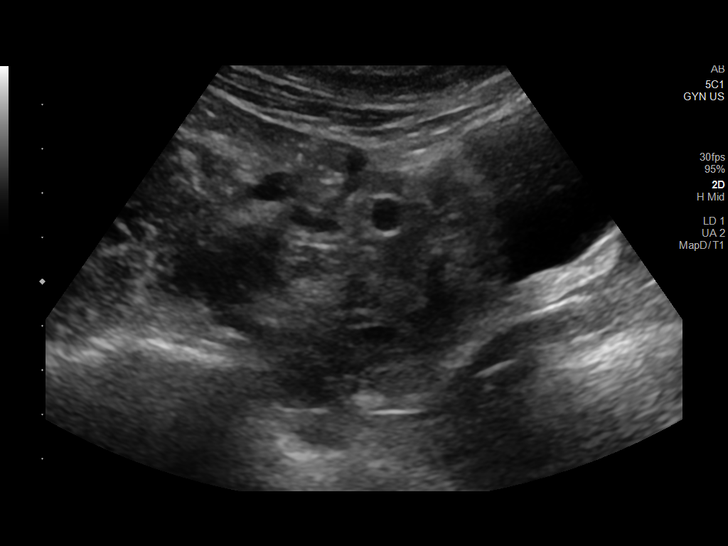
[im 23/56]
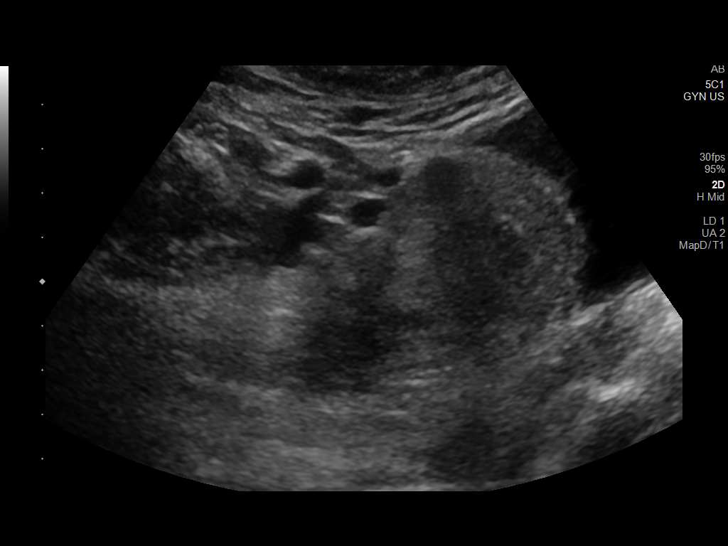
[im 28/56]
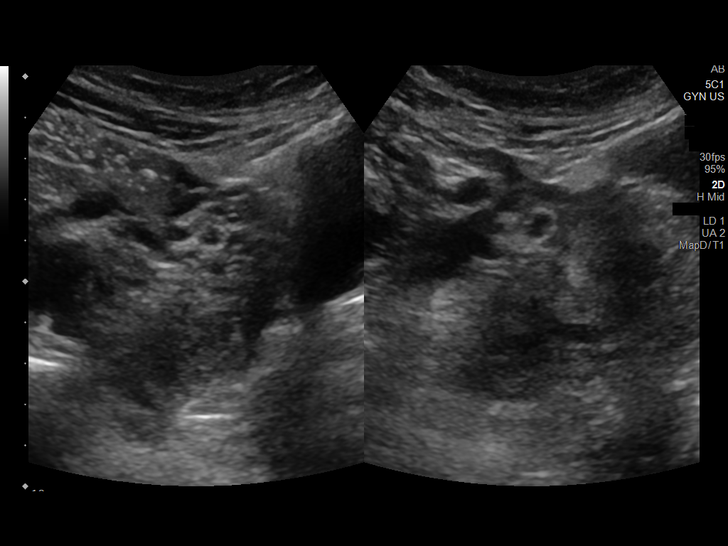
[im 33/56]
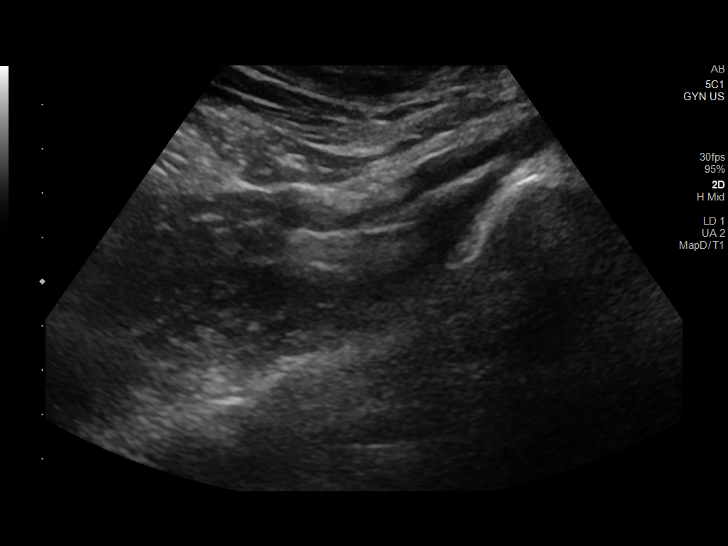
[im 37/56]
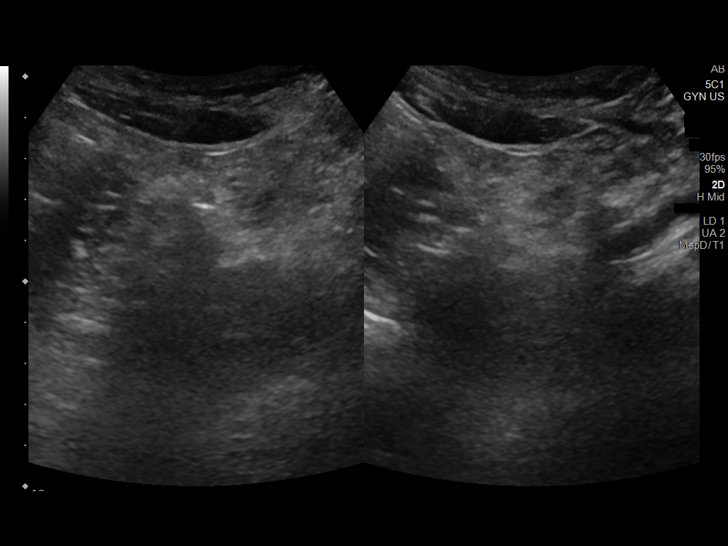
[im 42/56]
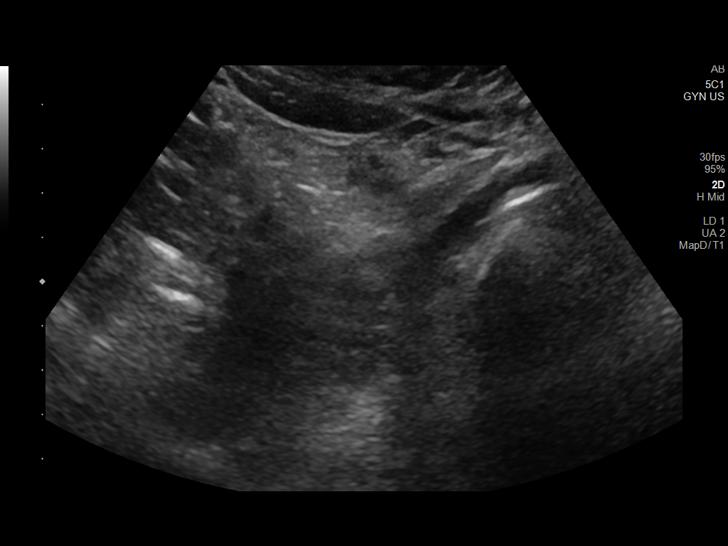
[im 46/56]
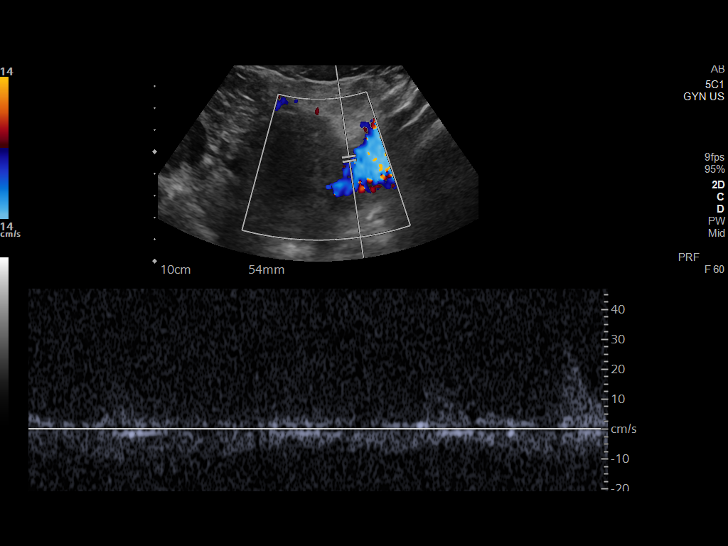
[im 51/56]
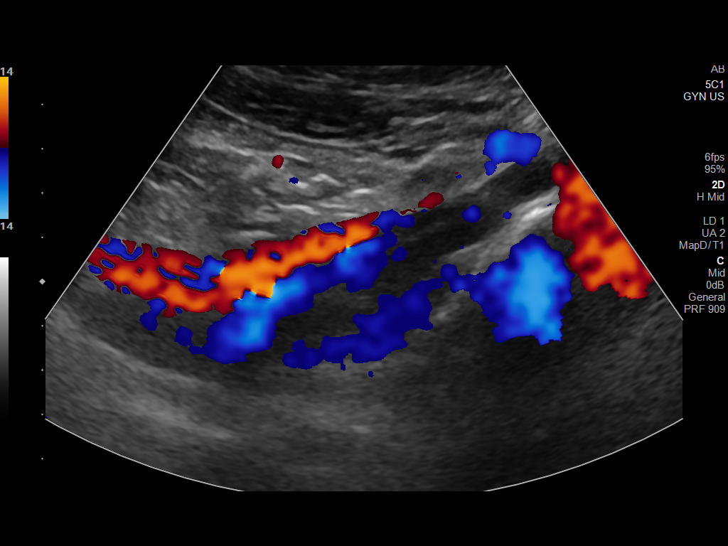
[im 56/56]
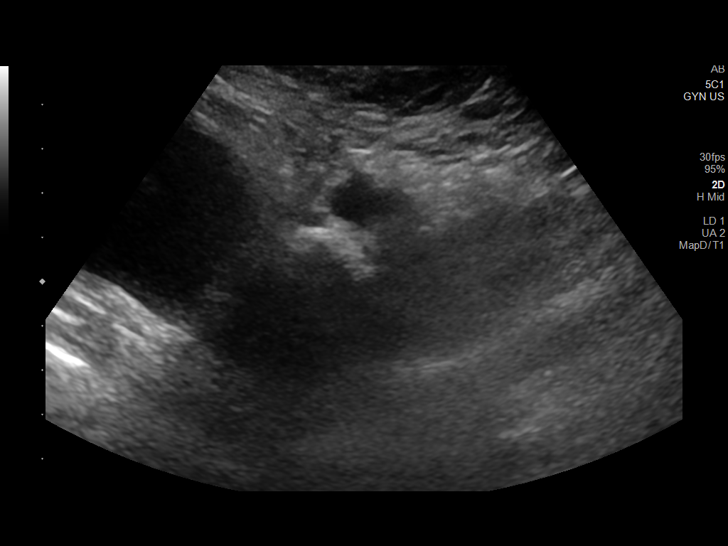

[13 of 25 positions shown; findings below may reference images not displayed]

FINDINGS: Uterus

Measurements: 7.4 x 5.8 x 4.8 cm = volume: 109.6 mL. No fibroids or
other mass visualized.

Endometrium

Thickness: 13 mm.  No focal abnormality visualized.

Right ovary

Measurements: 3.8 x 2.7 x 3.2 cm = volume: 17.7 mL. Complex 2.2 cm
structure consistent with a hemorrhagic cyst or corpus luteum. Ovary
otherwise unremarkable. No adnexal masses.

Left ovary

Measurements: 3.3 x 3.1 x 2.5 cm = volume: There is 13.1 mL. Normal
appearance/no adnexal mass.

Pulsed Doppler evaluation demonstrates normal low-resistance
arterial and venous waveforms in both ovaries.

Other: No abnormal pelvic free fluid.
IMPRESSION: 1. Small complex cyst, 2.2 cm, arising from the right ovary, which
may reflect a hemorrhagic cyst.
2. Exam otherwise unremarkable.  No ovarian torsion.

## 2022-11-11 LAB — GLUCOSE, POCT (MANUAL RESULT ENTRY): POC Glucose: 98 mg/dl (ref 70–99)

## 2022-11-11 NOTE — Progress Notes (Signed)
Pt has eaten today

## 2022-11-23 ENCOUNTER — Encounter: Payer: Self-pay | Admitting: *Deleted

## 2022-11-30 NOTE — Progress Notes (Signed)
Pt attended the 11/11/22 screening event. - Unable to contact pt by phone x 3  to verify whether she has a PCP- event notes document upcoming appt with Juluis Mire, NP, at [facility] on January 01, 2023. SDOH barriers from past screenings indicate that pt has had transportation and food needs so lists of community resources for both needs sent to pt with letter, including upcoming appt reminder and contact info for [facility] office.

## 2023-01-01 ENCOUNTER — Encounter (INDEPENDENT_AMBULATORY_CARE_PROVIDER_SITE_OTHER): Payer: Self-pay | Admitting: Primary Care

## 2023-01-01 ENCOUNTER — Ambulatory Visit (INDEPENDENT_AMBULATORY_CARE_PROVIDER_SITE_OTHER): Payer: Medicaid Other | Admitting: Primary Care

## 2023-01-01 VITALS — BP 111/72 | HR 78 | Resp 16 | Ht 61.0 in | Wt 177.8 lb

## 2023-01-01 DIAGNOSIS — M25559 Pain in unspecified hip: Secondary | ICD-10-CM | POA: Diagnosis not present

## 2023-01-01 DIAGNOSIS — Z7689 Persons encountering health services in other specified circumstances: Secondary | ICD-10-CM

## 2023-01-01 DIAGNOSIS — E559 Vitamin D deficiency, unspecified: Secondary | ICD-10-CM

## 2023-01-01 DIAGNOSIS — E6609 Other obesity due to excess calories: Secondary | ICD-10-CM

## 2023-01-01 DIAGNOSIS — Z6834 Body mass index (BMI) 34.0-34.9, adult: Secondary | ICD-10-CM

## 2023-01-01 DIAGNOSIS — R109 Unspecified abdominal pain: Secondary | ICD-10-CM

## 2023-01-01 NOTE — Patient Instructions (Addendum)
Prediabetes Prediabetes is when your blood sugar (blood glucose) level is higher than normal but not high enough for you to be diagnosed with type 2 diabetes. Having prediabetes puts you at risk for developing type 2 diabetes (type 2 diabetes mellitus). With certain lifestyle changes, you may be able to prevent or delay the onset of type 2 diabetes. This is important because type 2 diabetes can lead to serious complications, such as: Heart disease. Stroke. Blindness. Kidney disease. Depression. Poor circulation in the feet and legs. In severe cases, this could lead to surgical removal of a leg (amputation). What are the causes? The exact cause of prediabetes is not known. It may result from insulin resistance. Insulin resistance develops when cells in the body do not respond properly to insulin that the body makes. This can cause excess glucose to build up in the blood. High blood glucose (hyperglycemia) can develop. What increases the risk? The following factors may make you more likely to develop this condition: You have a family member with type 2 diabetes. You are older than 45 years. You had a temporary form of diabetes during a pregnancy (gestational diabetes). You had polycystic ovary syndrome (PCOS). You are overweight or obese. You are inactive (sedentary). You have a history of heart disease, including problems with cholesterol levels, high levels of blood fats, or high blood pressure. What are the signs or symptoms? You may have no symptoms. If you do have symptoms, they may include: Increased hunger. Increased thirst. Increased urination. Vision changes, such as blurry vision. Tiredness (fatigue). How is this diagnosed? This condition can be diagnosed with blood tests. Your blood glucose may be checked with one or more of the following tests: A fasting blood glucose (FBG) test. You will not be allowed to eat (you will fast) for at least 8 hours before a blood sample is  taken. An A1C blood test (hemoglobin A1C). This test provides information about blood glucose levels over the previous 2?3 months. An oral glucose tolerance test (OGTT). This test measures your blood glucose at two points in time: After fasting. This is your baseline level. Two hours after you drink a beverage that contains glucose. You may be diagnosed with prediabetes if: Your FBG is 100?125 mg/dL (5.6-6.9 mmol/L). Your A1C level is 5.7?6.4% (39-46 mmol/mol). Your OGTT result is 140?199 mg/dL (7.8-11 mmol/L). These blood tests may be repeated to confirm your diagnosis. How is this treated? Treatment may include dietary and lifestyle changes to help lower your blood glucose and prevent type 2 diabetes from developing. In some cases, medicine may be prescribed to help lower the risk of type 2 diabetes. Follow these instructions at home: Nutrition  Follow a healthy meal plan. This includes eating lean proteins, whole grains, legumes, fresh fruits and vegetables, low-fat dairy products, and healthy fats. Follow instructions from your health care provider about eating or drinking restrictions. Meet with a dietitian to create a healthy eating plan that is right for you. Lifestyle Do moderate-intensity exercise for at least 30 minutes a day on 5 or more days each week, or as told by your health care provider. A mix of activities may be best, such as: Brisk walking, swimming, biking, and weight lifting. Lose weight as told by your health care provider. Losing 5-7% of your body weight can reverse insulin resistance. Do not drink alcohol if: Your health care provider tells you not to drink. You are pregnant, may be pregnant, or are planning to become pregnant. If you drink alcohol:   Limit how much you use to: 0-1 drink a day for women. 0-2 drinks a day for men. Be aware of how much alcohol is in your drink. In the [place] one drink equals one 12 oz bottle of beer (355 mL), one 5 oz glass of wine  (148 mL), or one 1 oz glass of hard liquor (44 mL). General instructions Take over-the-counter and prescription medicines only as told by your health care provider. You may be prescribed medicines that help lower the risk of type 2 diabetes. Do not use any products that contain nicotine or tobacco, such as cigarettes, e-cigarettes, and chewing tobacco. If you need help quitting, ask your health care provider. Keep all follow-up visits. This is important. Where to find more information American Diabetes Association: www.diabetes.org Academy of Nutrition and Dietetics: www.eatright.org American Heart Association: www.heart.org Contact a health care provider if: You have any of these symptoms: Increased hunger. Increased urination. Increased thirst. Fatigue. Vision changes, such as blurry vision. Get help right away if you: Have shortness of breath. Feel confused. Vomit or feel like you may vomit. Summary Prediabetes is when your blood sugar (blood glucose)level is higher than normal but not high enough for you to be diagnosed with type 2 diabetes. Having prediabetes puts you at risk for developing type 2 diabetes (type 2 diabetes mellitus). Make lifestyle changes such as eating a healthy diet and exercising regularly to help prevent diabetes. Lose weight as told by your health care provider. This information is not intended to replace advice given to you by your health care provider. Make sure you discuss any questions you have with your health care provider. Document Revised: 12/14/2019 Document Reviewed: 12/14/2019 Elsevier Patient Education  2023 Elsevier Inc. Calorie Counting for [employer]Weight Loss Calories are units of energy. Your body needs a certain number of calories from food to keep going throughout the day. When you eat or drink more calories than your body needs, your body stores the extra calories mostly as fat. When you eat or drink fewer calories than your body needs, your body  burns fat to get the energy it needs. Calorie counting means keeping track of how many calories you eat and drink each day. Calorie counting can be helpful if you need to lose weight. If you eat fewer calories than your body needs, you should lose weight. Ask your health care provider what a healthy weight is for you. For calorie counting to work, you will need to eat the right number of calories each day to lose a healthy amount of weight per week. A dietitian can help you figure out how many calories you need in a day and will suggest ways to reach your calorie goal. A healthy amount of weight to lose each week is usually 1-2 lb (0.5-0.9 kg). This usually means that your daily calorie intake should be reduced by 500-750 calories. Eating 1,200-1,500 calories a day can help most women lose weight. Eating 1,500-1,800 calories a day can help most men lose weight. What do I need to know about calorie counting? Work with your health care provider or dietitian to determine how many calories you should get each day. To meet your daily calorie goal, you will need to: Find out how many calories are in each food that you would like to eat. Try to do this before you eat. Decide how much of the food you plan to eat. Keep a food log. Do this by writing down what you ate and how many calories  it had. To successfully lose weight, it is important to balance calorie counting with a healthy lifestyle that includes regular activity. Where do I find calorie information?  The number of calories in a food can be found on a Nutrition Facts label. If a food does not have a Nutrition Facts label, try to look up the calories online or ask your dietitian for help. Remember that calories are listed per serving. If you choose to have more than one serving of a food, you will have to multiply the calories per serving by the number of servings you plan to eat. For example, the label on a package of bread might say that a serving  size is 1 slice and that there are 90 calories in a serving. If you eat 1 slice, you will have eaten 90 calories. If you eat 2 slices, you will have eaten 180 calories. How do I keep a food log? After each time that you eat, record the following in your food log as soon as possible: What you ate. Be sure to include toppings, sauces, and other extras on the food. How much you ate. This can be measured in cups, ounces, or number of items. How many calories were in each food and drink. The total number of calories in the food you ate. Keep your food log near you, such as in a pocket-sized notebook or on an app or website on your mobile phone. Some programs will calculate calories for you and show you how many calories you have left to meet your daily goal. What are some portion-control tips? Know how many calories are in a serving. This will help you know how many servings you can have of a certain food. Use a measuring cup to measure serving sizes. You could also try weighing out portions on a kitchen scale. With time, you will be able to estimate serving sizes for some foods. Take time to put servings of different foods on your favorite plates or in your favorite bowls and cups so you know what a serving looks like. Try not to eat straight from a food's packaging, such as from a bag or box. Eating straight from the package makes it hard to see how much you are eating and can lead to overeating. Put the amount you would like to eat in a cup or on a plate to make sure you are eating the right portion. Use smaller plates, glasses, and bowls for smaller portions and to prevent overeating. Try not to multitask. For example, avoid watching TV or using your computer while eating. If it is time to eat, sit down at a table and enjoy your food. This will help you recognize when you are full. It will also help you be more mindful of what and how much you are eating. What are tips for following this plan? Reading  food labels Check the calorie count compared with the serving size. The serving size may be smaller than what you are used to eating. Check the source of the calories. Try to choose foods that are high in protein, fiber, and vitamins, and low in saturated fat, trans fat, and sodium. Shopping Read nutrition labels while you shop. This will help you make healthy decisions about which foods to buy. Pay attention to nutrition labels for low-fat or fat-free foods. These foods sometimes have the same number of calories or more calories than the full-fat versions. They also often have added sugar, starch, or salt to make  up for flavor that was removed with the fat. Make a grocery list of lower-calorie foods and stick to it. Cooking Try to cook your favorite foods in a healthier way. For example, try baking instead of frying. Use low-fat dairy products. Meal planning Use more fruits and vegetables. One-half of your plate should be fruits and vegetables. Include lean proteins, such as chicken, [place]urkey, and fish. Lifestyle Each week, aim to do one of the following: 150 minutes of moderate exercise, such as walking. 75 minutes of vigorous exercise, such as running. General information Know how many calories are in the foods you eat most often. This will help you calculate calorie counts faster. Find a way of tracking calories that works for you. Get creative. Try different apps or programs if writing down calories does not work for you. What foods should I eat?  Eat nutritious foods. It is better to have a nutritious, high-calorie food, such as an avocado, than a food with few nutrients, such as a bag of potato chips. Use your calories on foods and drinks that will fill you up and will not leave you hungry soon after eating. Examples of foods that fill you up are nuts and nut butters, vegetables, lean proteins, and high-fiber foods such as whole grains. High-fiber foods are foods with more than 5 g of  fiber per serving. Pay attention to calories in drinks. Low-calorie drinks include water and unsweetened drinks. The items listed above may not be a complete list of foods and beverages you can eat. Contact a dietitian for more information. What foods should I limit? Limit foods or drinks that are not good sources of vitamins, minerals, or protein or that are high in unhealthy fats. These include: Candy. Other sweets. Sodas, specialty coffee drinks, alcohol, and juice. The items listed above may not be a complete list of foods and beverages you should avoid. Contact a dietitian for more information. How do I count calories when eating out? Pay attention to portions. Often, portions are much larger when eating out. Try these tips to keep portions smaller: Consider sharing a meal instead of getting your own. If you get your own meal, eat only half of it. Before you start eating, ask for a container and put half of your meal into it. When available, consider ordering smaller portions from the menu instead of full portions. Pay attention to your food and drink choices. Knowing the way food is cooked and what is included with the meal can help you eat fewer calories. If calories are listed on the menu, choose the lower-calorie options. Choose dishes that include vegetables, fruits, whole grains, low-fat dairy products, and lean proteins. Choose items that are boiled, broiled, grilled, or steamed. Avoid items that are buttered, battered, fried, or served with cream sauce. Items labeled as crispy are usually fried, unless stated otherwise. Choose water, low-fat milk, unsweetened iced tea, or other drinks without added sugar. If you want an alcoholic beverage, choose a lower-calorie option, such as a glass of wine or light beer. Ask for dressings, sauces, and syrups on the side. These are usually high in calories, so you should limit the amount you eat. If you want a salad, choose a garden salad and ask  for grilled meats. Avoid extra toppings such as bacon, cheese, or fried items. Ask for the dressing on the side, or ask for olive oil and vinegar or lemon to use as dressing. Estimate how many servings of a food you are given. Knowing  serving sizes will help you be aware of how much food you are eating at restaurants. Where to find more information Centers for Disease Control and Prevention: FootballExhibition.com.br U.S. Department of Agriculture: WrestlingReporter.dk Summary Calorie counting means keeping track of how many calories you eat and drink each day. If you eat fewer calories than your body needs, you should lose weight. A healthy amount of weight to lose per week is usually 1-2 lb (0.5-0.9 kg). This usually means reducing your daily calorie intake by 500-750 calories. The number of calories in a food can be found on a Nutrition Facts label. If a food does not have a Nutrition Facts label, try to look up the calories online or ask your dietitian for help. Use smaller plates, glasses, and bowls for smaller portions and to prevent overeating. Use your calories on foods and drinks that will fill you up and not leave you hungry shortly after a meal. This information is not intended to replace advice given to you by your health care provider. Make sure you discuss any questions you have with your health care provider. Document Revised: 10/26/2019 Document Reviewed: 10/26/2019 Elsevier Patient Education  2023 [employer].

## 2023-01-01 NOTE — Progress Notes (Signed)
New Patient Office Visit  Subjective    Patient ID: Christina Ingram, female    DOB: 04/11/1996  Age: 27 y.o. MRN: [id number]  CC: No chief complaint on file.   HPI Christina Ingram is a 27 year old obese female who presents to establish care.  Patient is accompanied by her boyfriend TJ which she has given permission to be at this appointment.  Patient is voicing problems with painful excruciating when her menstrual cycle is on to the place it causes her not to want to get out of the bed or go anywhere.  She was told by GYN per patient she has a ovarian cyst.  Which could be the underlying cause of heavy menstrual cycles and pain.  She also is concerned about inability to conceive.  Other issues are back pain and bilateral knee pain with swelling in the joints. Patient has No headache, No chest pain, No abdominal pain - No Nausea, No new weakness tingling or numbness, No Cough - shortness of breath    Outpatient Encounter Medications as of 01/01/2023  Medication Sig   [DISCONTINUED] potassium chloride SA (K-DUR,KLOR-CON) 20 MEQ tablet Take 1 tablet (20 mEq total) by mouth daily. (Patient not taking: Reported on 12/01/2018)   No facility-administered encounter medications on file as of 01/01/2023.    Past Medical History:  Diagnosis Date   Hypertension     Past Surgical History:  Procedure Laterality Date   WISDOM TOOTH EXTRACTION      Family History  Problem Relation Age of Onset   Cancer Mother    Clotting disorder Mother    Venous thrombosis Mother    Hypertension Mother    Diabetes Maternal Grandmother    Hypertension Maternal Grandmother    Diabetes Maternal Grandfather     Social History   Socioeconomic History   Marital status: Single    Spouse name: Not on file   Number of children: Not on file   Years of education: Not on file   Highest education level: Not on file  Occupational History   Not on file  Tobacco Use   Smoking status: Former    Packs/day: .25     Types: Cigarettes   Smokeless tobacco: Never  Vaping Use   Vaping Use: Never used  Substance and Sexual Activity   Alcohol use: No   Drug use: No   Sexual activity: Yes    Birth control/protection: Implant  Other Topics Concern   Not on file  Social History Narrative   Not on file   Social Determinants of Health   Financial Resource Strain: Not on file  Food Insecurity: Food Insecurity Present (02/11/2022)   Hunger Vital Sign    Worried About Running Out of Food in the Last Year: Often true    Ran Out of Food in the Last Year: Often true  Transportation Needs: Unmet Transportation Needs (02/11/2022)   PRAPARE - Administrator, Civil ServiceTransportation    Lack of Transportation (Medical): No    Lack of Transportation (Non-Medical): Yes  Physical Activity: Not on file  Stress: Not on file  Social Connections: Not on file  Intimate Partner Violence: Not on file    ROS Comprehensive ROS Pertinent positive and negative noted in HPI     Objective   Blood Pressure 111/72   Pulse 78   Respiration 16   Height 5\' 1"  (1.549 m)   Weight 177 lb 12.8 oz (80.6 kg)   Last Menstrual Period 12/07/2022   Oxygen Saturation 95%  Body Mass Index 33.60 kg/m    Physical exam: General: Vital signs reviewed.  Patient is well-developed and well-nourished, obese female  in no acute distress and cooperative with exam. Head: Normocephalic and atraumatic. Eyes: EOMI, conjunctivae normal, no scleral icterus. Neck: Supple, trachea midline, normal ROM, no JVD, masses, thyromegaly, or carotid bruit present. Cardiovascular: RRR, S1 normal, S2 normal, no murmurs, gallops, or rubs. Pulmonary/Chest: Clear to auscultation bilaterally, no wheezes, rales, or rhonchi. Abdominal: Soft, non-tender, non-distended, BS +, no masses, organomegaly, or guarding present. Musculoskeletal: No joint deformities, erythema, or stiffness, ROM full and nontender. Extremities: No lower extremity edema bilaterally,  pulses symmetric and intact  bilaterally. No cyanosis or clubbing. Neurological: A&O x3, Strength is normal Skin: Warm, dry and intact. No rashes or erythema. Psychiatric: Normal mood and affect. speech and behavior is normal. Cognition and memory are normal.       Assessment & Plan:  Sibylla was seen today for new patient (initial visit), back pain, leg pain and pelvic pain.  Diagnoses and all orders for this visit:  Encounter to establish care  Abdominal pain, unspecified abdominal location 2/2 Pain in joint involving pelvic region and thigh, unspecified laterality -     Ambulatory referral to Gynecology  Class 1 obesity due to excess calories without serious comorbidity with body mass index (BMI) of 34.0 to 34.9 in adult Obesity is 30-39 indicating an excess in caloric intake or underlining conditions. This may lead to other co-morbidities. Educated on lifestyle modifications of diet and exercise which may reduce obesity.      Vitamin D deficiency  Your Vitamin D is low. Vitamin D is needed to make and keep bones strong.  You can purchase  vitamin D 2000 iu over the counter  and take 1 tablet daily.   Grayce Sessions, NP

## 2023-01-11 ENCOUNTER — Encounter: Payer: Self-pay | Admitting: *Deleted

## 2023-01-11 NOTE — Progress Notes (Incomplete)
Chart review indicates pt established care with Christina Passe, NP on 01/01/23 and a PCP future appt on 05/24/23 and an ob/gyn referral. No additional SDOH insecurities noted at the 01/01/23 appt. Event f/u letter sent to pt 2/16 with food pantry and transportation options, including her Medicaid transportation options; and in-basket note note sent to Care Manager for [facility] to f/u on food and transportation barriers noted at the 11/11/22 event, if needed. No additional health equity team support indicated at this time.

## 2023-01-14 ENCOUNTER — Telehealth: Payer: Self-pay

## 2023-01-14 NOTE — Telephone Encounter (Signed)
Message received from Dorie Rank, RN/ [employer] noting that the patient may need assistance managing SDOH needs. She may benefit from a referral to Managed Medicaid Care Management. I tried to reach the patient: [phone] and the recording stated that the call cannot be completed at this time.

## 2023-01-21 NOTE — Telephone Encounter (Signed)
I tried to reach the patient again,  [phone] and the recording still stated that the call cannot be completed at this time

## 2023-01-25 NOTE — Telephone Encounter (Signed)
I tried to reach the patient again,  [phone] and the recording still stated that the call cannot be completed at this time       

## 2023-02-11 IMAGING — US US PELVIS COMPLETE TRANSABD/TRANSVAG W DUPLEX
1 series · 13 of 25 positions shown · non-contrast
Comparison: None.

CLINICAL DATA: Right-sided pelvic pain for 1 day.



[Series 1: us pelvic complete w transvaginal and torsion righ · 13 of 94 slices shown]
[im 1/94]
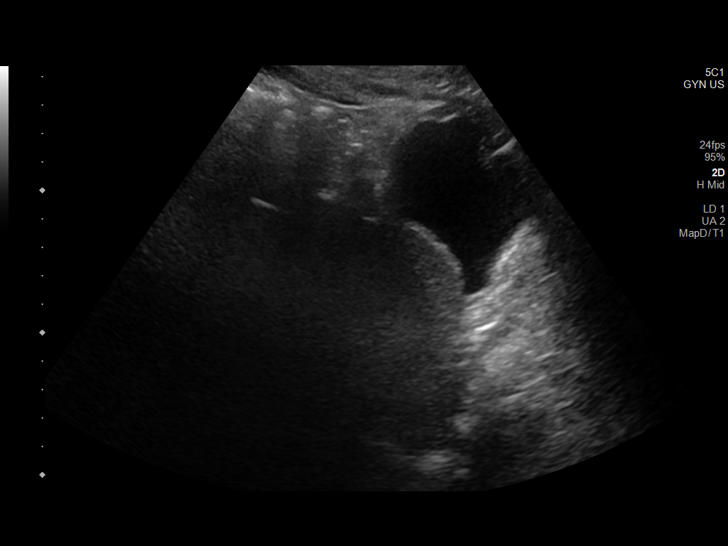
[im 8/94]
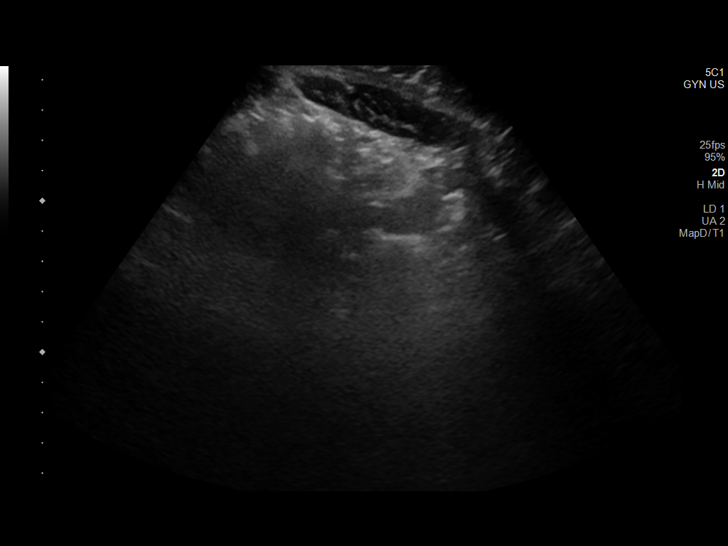
[im 16/94]
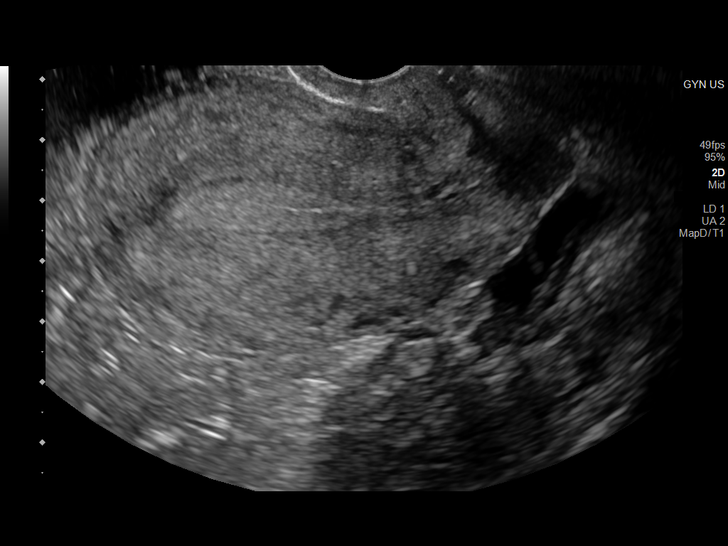
[im 24/94]
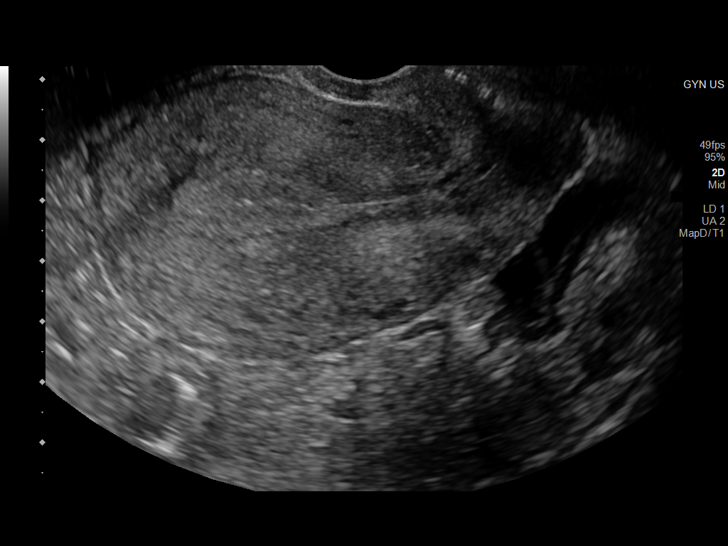
[im 32/94]
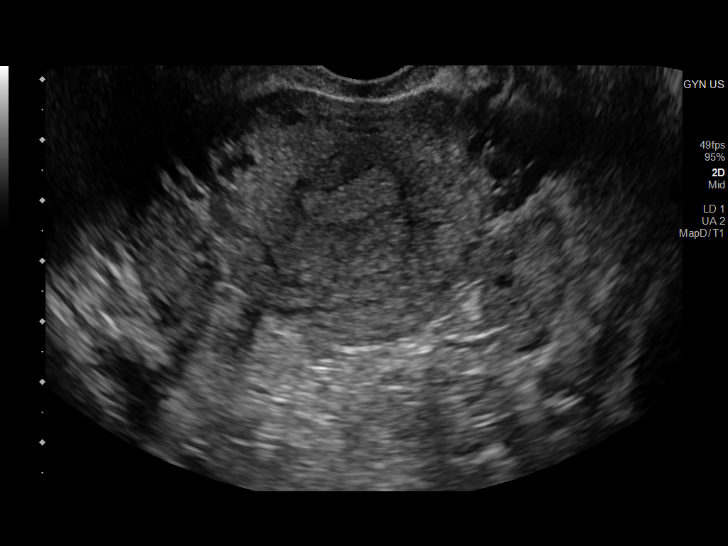
[im 39/94]
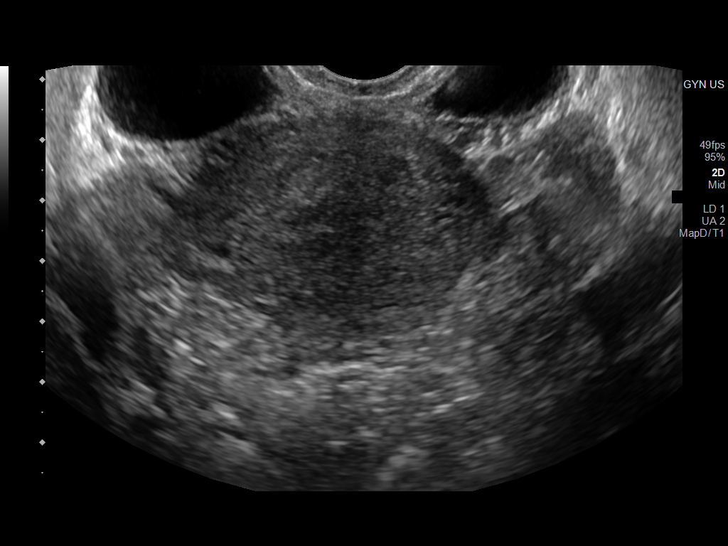
[im 47/94]
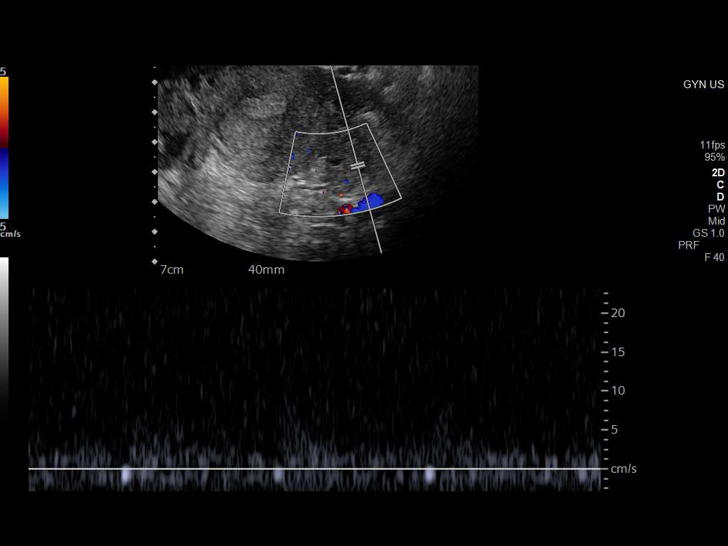
[im 55/94]
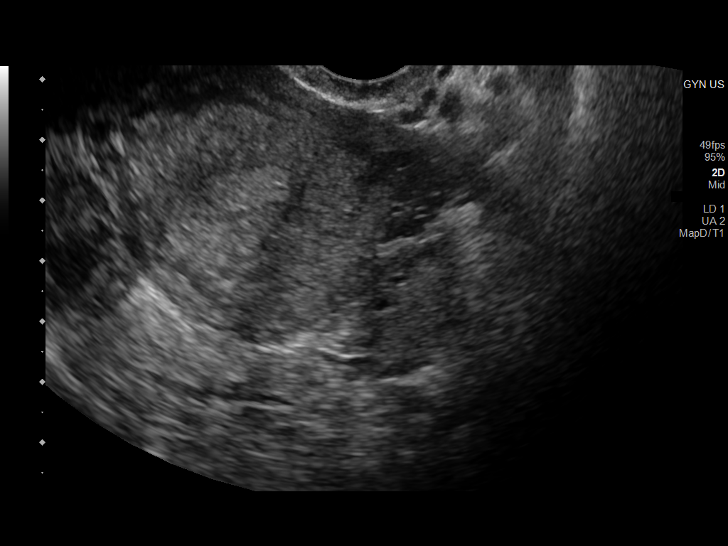
[im 63/94]
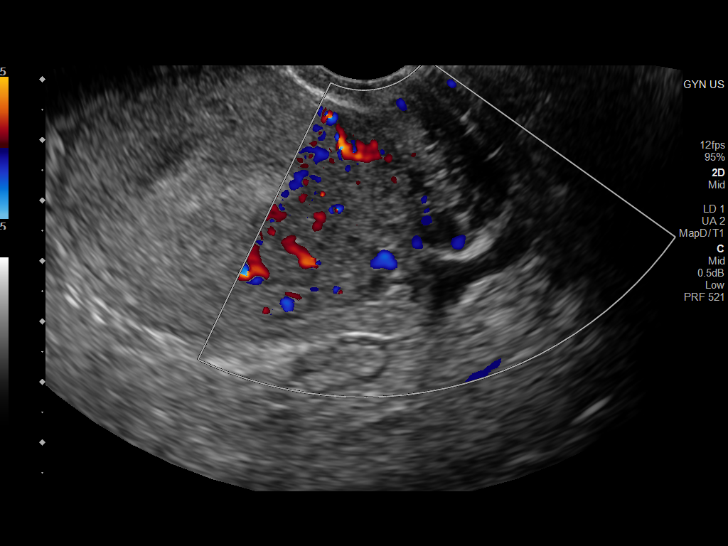
[im 70/94]
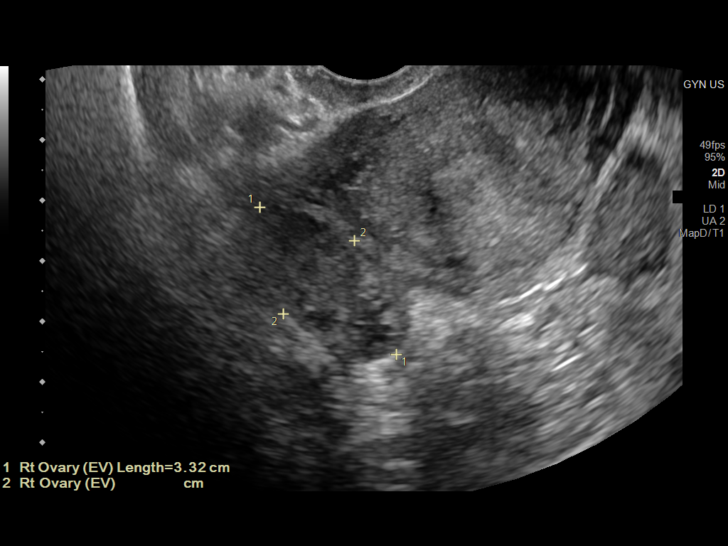
[im 78/94]
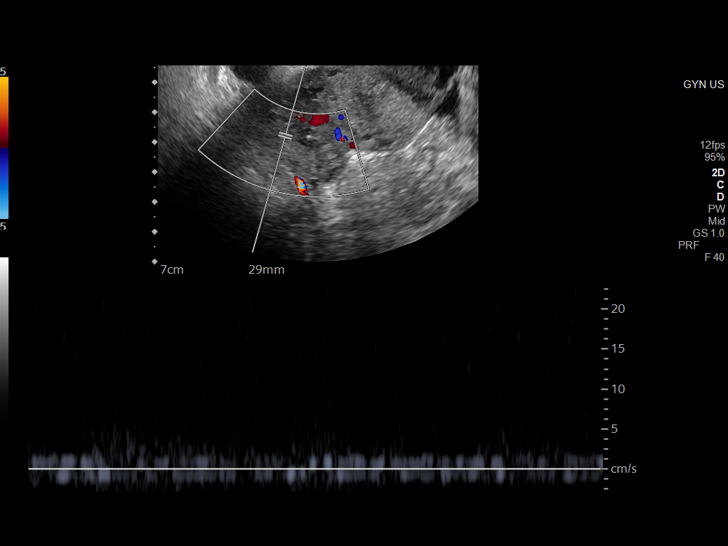
[im 86/94]
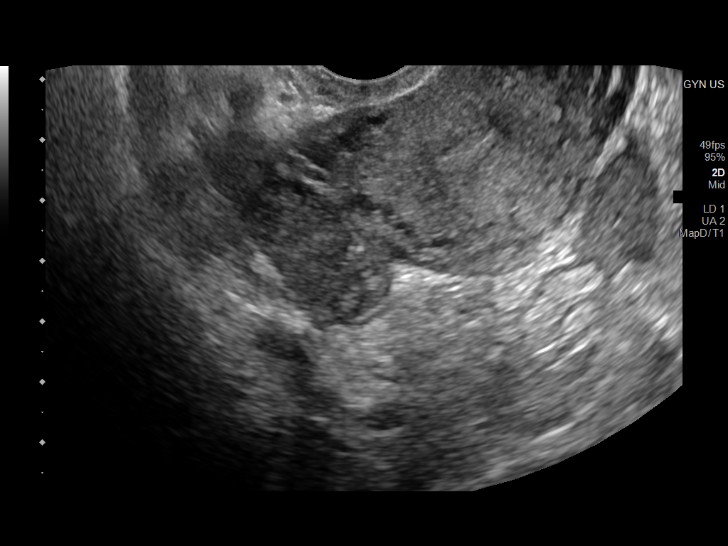
[im 94/94]
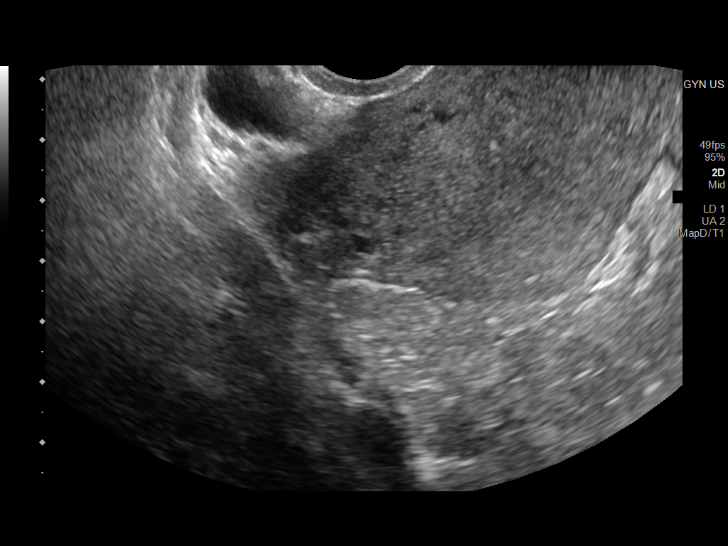

[13 of 25 positions shown; findings below may reference images not displayed]

FINDINGS: Uterus

Measurements: 9.1 x 4.6 x 5.5 cm = volume: 118.5 mL. No fibroids or
other mass visualized.

Endometrium

Thickness: 12 mm.  No focal abnormality visualized.

Right ovary

Measurements: 3.3 x 1.7 x 1.8 cm = volume: 5.3 mL. Normal
appearance/no adnexal mass.

Left ovary

Measurements: 2.8 x 2.1 x 2.0 cm = volume: 6.22 mL. Normal
appearance/no adnexal mass.

Pulsed Doppler examination of both ovaries demonstrates normal
venous and arterial blood flow.

Other findings

No abnormal free fluid.
IMPRESSION: Normal pelvic ultrasound examination.

## 2023-02-15 ENCOUNTER — Ambulatory Visit (INDEPENDENT_AMBULATORY_CARE_PROVIDER_SITE_OTHER): Payer: Medicaid Other

## 2023-02-15 ENCOUNTER — Other Ambulatory Visit: Payer: Self-pay

## 2023-02-15 ENCOUNTER — Encounter (HOSPITAL_COMMUNITY): Payer: Self-pay | Admitting: Emergency Medicine

## 2023-02-15 ENCOUNTER — Ambulatory Visit (HOSPITAL_COMMUNITY)
Admission: EM | Admit: 2023-02-15 | Discharge: 2023-02-15 | Disposition: A | Discharge status: Home / Self Care | Payer: Medicaid Other | Attending: Urgent Care | Admitting: Urgent Care

## 2023-02-15 DIAGNOSIS — S20211A Contusion of right front wall of thorax, initial encounter: Secondary | ICD-10-CM

## 2023-02-15 MED ORDER — METHOCARBAMOL 500 MG PO TABS: 500.0000 mg | ORAL_TABLET | Freq: Two times a day (BID) | ORAL | 0 refills | Status: DC

## 2023-02-15 MED ORDER — KETOROLAC TROMETHAMINE 30 MG/ML IJ SOLN
30.0000 mg | Freq: Once | INTRAMUSCULAR | Status: AC
  Administered 2023-02-15: 30 mg via INTRAMUSCULAR

## 2023-02-15 MED ORDER — KETOROLAC TROMETHAMINE 30 MG/ML IJ SOLN
INTRAMUSCULAR | Status: AC
  Filled 2023-02-15: qty 1

## 2023-02-15 MED ORDER — ETODOLAC 500 MG PO TABS: 500.0000 mg | ORAL_TABLET | Freq: Two times a day (BID) | ORAL | 0 refills | Status: DC
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
## 2023-02-15 NOTE — ED Notes (Signed)
Patient remains in xray 
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
## 2023-02-15 NOTE — Discharge Instructions (Signed)
Your xray is negative for any acute findings or fractures.  You may start taking the anti-inflammatory medication called in twice daily with food.  Do not take any additional over-the-counter anti-inflammatory medication such as Advil, Aleve, Motrin, naproxen, ibuprofen.  You may also take the muscle relaxer twice daily.  Take with caution as it may make you feel drowsy or sedated.  Apply ice alternating with heat to the affected area of your chest.

## 2023-03-12 ENCOUNTER — Ambulatory Visit (HOSPITAL_COMMUNITY)
Admission: EM | Admit: 2023-03-12 | Discharge: 2023-03-12 | Disposition: A | Discharge status: Home / Self Care | Payer: Medicaid Other | Attending: Emergency Medicine | Admitting: Emergency Medicine

## 2023-03-12 ENCOUNTER — Encounter (HOSPITAL_COMMUNITY): Payer: Self-pay

## 2023-03-12 ENCOUNTER — Other Ambulatory Visit: Payer: Self-pay

## 2023-03-12 ENCOUNTER — Ambulatory Visit (INDEPENDENT_AMBULATORY_CARE_PROVIDER_SITE_OTHER): Payer: Medicaid Other

## 2023-03-12 ENCOUNTER — Emergency Department (HOSPITAL_COMMUNITY): Payer: Medicaid Other

## 2023-03-12 ENCOUNTER — Encounter (HOSPITAL_COMMUNITY): Payer: Self-pay | Admitting: Emergency Medicine

## 2023-03-12 ENCOUNTER — Emergency Department (HOSPITAL_COMMUNITY)
Admission: EM | Admit: 2023-03-12 | Discharge: 2023-03-12 | Disposition: A | Discharge status: Home / Self Care | Payer: Medicaid Other | Attending: Emergency Medicine | Admitting: Emergency Medicine

## 2023-03-12 DIAGNOSIS — R0602 Shortness of breath: Secondary | ICD-10-CM | POA: Diagnosis not present

## 2023-03-12 DIAGNOSIS — F419 Anxiety disorder, unspecified: Secondary | ICD-10-CM | POA: Insufficient documentation

## 2023-03-12 DIAGNOSIS — R064 Hyperventilation: Secondary | ICD-10-CM | POA: Diagnosis not present

## 2023-03-12 DIAGNOSIS — Z79899 Other long term (current) drug therapy: Secondary | ICD-10-CM | POA: Diagnosis not present

## 2023-03-12 DIAGNOSIS — F410 Panic disorder [episodic paroxysmal anxiety] without agoraphobia: Secondary | ICD-10-CM

## 2023-03-12 LAB — I-STAT BETA HCG BLOOD, ED (MC, WL, AP ONLY): I-stat hCG, quantitative: <5 m[IU]/mL (ref <5)

## 2023-03-12 LAB — BASIC METABOLIC PANEL
Anion gap: 10 (ref 5–15)
BUN: 14 mg/dL (ref 6–20)
CO2: 23 mmol/L (ref 22–32)
Calcium: 9.5 mg/dL (ref 8.9–10.3)
Chloride: 105 mmol/L (ref 98–111)
Creatinine, Ser: 0.82 mg/dL (ref 0.44–1.00)
GFR, Estimated: >60 mL/min (ref >60)
Glucose, Bld: 97 mg/dL (ref 70–99)
Potassium: 3.5 mmol/L (ref 3.5–5.1)
Sodium: 138 mmol/L (ref 135–145)

## 2023-03-12 LAB — CBC
HCT: 43.4 % (ref 36.0–46.0)
Hemoglobin: 13.7 g/dL (ref 12.0–15.0)
MCH: 28.4 pg (ref 26.0–34.0)
MCHC: 31.6 g/dL (ref 30.0–36.0)
MCV: 90 fL (ref 80.0–100.0)
Platelets: 308 10*3/uL (ref 150–400)
RBC: 4.82 MIL/uL (ref 3.87–5.11)
RDW: 12.6 % (ref 11.5–15.5)
WBC: 9 10*3/uL (ref 4.0–10.5)
nRBC: 0 % (ref 0.0–0.2)

## 2023-03-12 LAB — RAPID URINE DRUG SCREEN, HOSP PERFORMED
Amphetamines: NOT DETECTED
Barbiturates: NOT DETECTED
Benzodiazepines: NOT DETECTED
Cocaine: NOT DETECTED
Opiates: NOT DETECTED
Tetrahydrocannabinol: NOT DETECTED

## 2023-03-12 LAB — ETHANOL: Alcohol, Ethyl (B): <10 mg/dL (ref <10)

## 2023-03-12 MED ORDER — FAMOTIDINE IN NACL 20-0.9 MG/50ML-% IV SOLN
20.0000 mg | Freq: Once | INTRAVENOUS | Status: AC
  Administered 2023-03-12: 20 mg via INTRAVENOUS
  Filled 2023-03-12: qty 50

## 2023-03-12 MED ORDER — DIPHENHYDRAMINE HCL 50 MG/ML IJ SOLN
25.0000 mg | Freq: Once | INTRAMUSCULAR | Status: AC
  Administered 2023-03-12: 25 mg via INTRAVENOUS
  Filled 2023-03-12: qty 1

## 2023-03-12 MED ORDER — EPINEPHRINE 0.3 MG/0.3ML IJ SOAJ
INTRAMUSCULAR | Status: AC
  Filled 2023-03-12: qty 0.3

## 2023-03-12 MED ORDER — METHYLPREDNISOLONE SODIUM SUCC 125 MG IJ SOLR
125.0000 mg | Freq: Once | INTRAMUSCULAR | Status: AC
  Administered 2023-03-12: 125 mg via INTRAVENOUS
  Filled 2023-03-12: qty 2

## 2023-03-12 MED ORDER — IPRATROPIUM-ALBUTEROL 0.5-2.5 (3) MG/3ML IN SOLN
RESPIRATORY_TRACT | Status: AC
  Filled 2023-03-12: qty 3

## 2023-03-12 MED ORDER — HYDROXYZINE HCL 25 MG PO TABS: 25.0000 mg | ORAL_TABLET | Freq: Three times a day (TID) | ORAL | 0 refills | Status: DC | PRN

## 2023-03-12 MED ORDER — IPRATROPIUM-ALBUTEROL 0.5-2.5 (3) MG/3ML IN SOLN
3.0000 mL | Freq: Once | RESPIRATORY_TRACT | Status: AC
  Administered 2023-03-12: 3 mL via RESPIRATORY_TRACT

## 2023-03-12 MED ORDER — EPINEPHRINE 0.3 MG/0.3ML IJ SOAJ
0.3000 mg | Freq: Once | INTRAMUSCULAR | Status: AC
  Administered 2023-03-12: 0.3 mg via INTRAMUSCULAR

## 2023-03-12 NOTE — ED Notes (Signed)
Pt ambulatory to bathroom
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
## 2023-03-12 NOTE — ED Notes (Signed)
Pt able to ambulate to the bathroom and appears more alert

## 2023-03-12 NOTE — ED Notes (Signed)
Patient is resting comfortably. 
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
## 2023-03-12 NOTE — ED Notes (Signed)
Patient is being discharged from the Urgent Care and sent to the Emergency Department via POV with family. Per Cyprus Garrison, NP, patient is in need of higher level of care due to Shortness of breath. Patient is aware and verbalizes understanding of plan of care.  Vitals:   03/12/23 0900  BP: 119/87  Pulse: 100  Resp: (!) 28  Temp: 98.5 F (36.9 C)  SpO2: 100%

## 2023-03-12 NOTE — ED Triage Notes (Signed)
Pt POV grasping throat and having difficulty breathing, labored, tachypneic. No hx of asthma or allergies. Pt reports it started at 0300. Pt reports she took some Vit D.

## 2023-03-12 NOTE — ED Triage Notes (Signed)
Pt reports that woke up this morning with SOB. Pt hyperventilating.

## 2023-03-12 NOTE — Discharge Instructions (Addendum)
You were seen in the ER for shortness of breath.  You were hyperventilating when you arrived in the emergency department.  You are given medications for a possible allergic reaction, based on the fact that you felt you are having throat tightness, although your later workup showed that there was no clear signs of true allergic reaction.    You may have had an anxiety or panic attack.  This can often cause severe feelings of shortness of breath, especially with breathing quickly, called hyperventilation.  When you breathe quickly can become lightheaded and dizzy.  I recommend that you follow-up with your primary care clinic, or your mental health counselor for this episode.  Your remaining blood tests and your chest x-ray did not show any medical emergencies in the ER today.

## 2023-03-12 NOTE — ED Triage Notes (Addendum)
During triage pt was instructed to try to deep breathe. Pt then had moments of apnea. Sternal rubbed pt and pt responded. Pt transported to room. Airway patent upon assessment.
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
## 2023-04-14 ENCOUNTER — Telehealth: Payer: Self-pay

## 2023-04-14 DIAGNOSIS — F419 Anxiety disorder, unspecified: Secondary | ICD-10-CM

## 2023-04-14 NOTE — Telephone Encounter (Signed)
I called patient to follow up from the ED visits. She has an appointment with Gwinda Passe, NP at [facility] on 05/24/2023.  I offered to schedule her with Marcelino Duster before 05/24/2023, even within the next 2 weeks as she had recent ED visits.  I also explained that she can be seen at our [facility] She said she may need an inhaler because she was in the ED for shortness of breath. I explained that the PCP, community care provider  would need to see her before prescribing that.  She said she understood but she is out of state right now visiting and may not be back until next month. I told her that she should see a provider , Urgent Care, ED in the state where she is at if she is having difficulty breathing and she said she understood.   I also explained to her I can refer her to Managed Medicaid Care Management to help her access any additional benefits that she may be eligible for and she was in agreement to that plan

## 2023-04-26 ENCOUNTER — Other Ambulatory Visit: Payer: Medicaid Other

## 2023-04-26 NOTE — Patient Outreach (Signed)
Medicaid Managed Care Social Work Note  04/26/2023 Name:  Christina Ingram MRN:  [id number] DOB:  1995/11/23  Christina Ingram is an 27 y.o. year old female who is a primary Christina Ingram of Christina Ingram, No Pcp Per.  The [facility] Managed Care Coordination team was consulted for assistance with:   [facility] extra benefits  Ms. Trimbur was given information about Medicaid Managed Care Coordination team services today. Radene Gunning Christina Ingram agreed to services and verbal consent obtained.  Engaged with Christina Ingram  for by telephone forinitial visit in response to referral for case management and/or care coordination services.   Assessments/Interventions:  Review of past medical history, allergies, medications, health status, including review of consultants reports, laboratory and other test data, was performed as part of comprehensive evaluation and provision of chronic care management services.  SDOH: (Social Determinant of Health) assessments and interventions performed: SDOH Interventions    Flowsheet Row Community Documentation from 01/11/2023 in [facility] Community Documentation from 11/23/2022 in [facility] Integrated Behavioral Health from 02/11/2022 in [facility] Office Visit from 02/04/2022 in [facility] for [employer] [facility] [facility]  SDOH Interventions      Food Insecurity Interventions AMB Referral  [in basket sent to PCP office care manager] Other (Comment)  [food pantry flyer given to pt in 11/23/22 event f/u letter] Stage manager [facility]) Stage manager ([facility] only)  Transportation Interventions AMB Referral  [in basket message sent to PCP office care manager] Other (Comment)  Landscape architect, including Medicaid ride options, mailed in 11/23/22 event f/u lettere] -- --      BSW completed a telephone outreach with Christina Ingram. She stated she does not have  a Child psychotherapist, BSW explained a referral was placed for information on extra benefits for [facility]. BSW informed Christina Ingram she could send her a list of the benefits or she could go online. Christina Ingram chose to go online to obtain the information. Advanced Directives Status:  Not addressed in this encounter.  Care Plan                 Allergies  Allergen Reactions   Oxycodone Other (See Comments)    dizziness   Penicillins Rash    Has Christina Ingram had a PCN reaction causing immediate rash, facial/tongue/throat swelling, SOB or lightheadedness with hypotension: Yes Has Christina Ingram had a PCN reaction causing severe rash involving mucus membranes or skin necrosis: No Has Christina Ingram had a PCN reaction that required hospitalization No Has Christina Ingram had a PCN reaction occurring within the last 10 years: Yes If all of the above answers are "NO", then may proceed with Cephalosporin use.     Medications Reviewed Today   Medications were not reviewed in this encounter     Christina Ingram Active Problem List   Diagnosis Date Noted   Tobacco use disorder 07/15/2022   Nexplanon insertion 06/18/2015   Postpartum depression 05/28/2015   IUGR (intrauterine growth restriction) 04/21/2015    Conditions to be addressed/monitored per PCP order:   [facility] extrea benefits  There are no care plans that you recently modified to display for this Christina Ingram.   Follow up:  Christina Ingram requests no follow-up at this time.  Plan: The  Christina Ingram has been provided with contact information for the Managed Medicaid care management team and has been advised to call with any health related questions or concerns.    Gus Puma, BSW,  MHA [place]  Managed [facility] Social Worker [phone]

## 2023-04-26 NOTE — Patient Instructions (Signed)
Visit Information  Ms. Lamour was given information about Medicaid Managed Care team care coordination services as a part of their [facility] Community Plan Medicaid benefit. Radene Gunning verbally consented to engagement with the [facility] Managed Care team.   If you are experiencing a medical emergency, please call 911 or report to your local emergency department or urgent care.   If you have a non-emergency medical problem during routine business hours, please contact your provider's office and ask to speak with a nurse.   For questions related to your [facility], please call: [phone] or visit the homepage here: [email or web address]  If you would like to schedule transportation through your [facility], please call the following number at least 2 days in advance of your appointment: [phone]   Rides for urgent appointments can also be made after hours by calling Member Services.  Call the Behavioral Health Crisis Line at [phone], at any time, 24 hours a day, 7 days a week. If you are in danger or need immediate medical attention call 911.  If you would like help to quit smoking, call 1-800-QUIT-NOW (563-635-8353) OR Espaol: 1-855-Djelo-Ya (1-324-401-0272) o para ms informacin haga clic aqu or Text READY to 536-644 to register via text  Ms. Heisner - following are the goals we discussed in your visit today:   Goals Addressed   None      The  Patient                                              has been provided with contact information for the Managed Medicaid care management team and has been advised to call with any health related questions or concerns.   Gus Puma, Kenard Gower, MHA [facility]  Managed Medicaid Social Worker [phone]   Following is a copy of your plan of care:  There are no care plans that you recently modified to display  for this patient.

## 2023-05-24 ENCOUNTER — Ambulatory Visit (INDEPENDENT_AMBULATORY_CARE_PROVIDER_SITE_OTHER): Payer: Medicaid Other | Admitting: Primary Care

## 2023-05-24 ENCOUNTER — Encounter (INDEPENDENT_AMBULATORY_CARE_PROVIDER_SITE_OTHER): Payer: Self-pay | Admitting: Primary Care

## 2023-05-24 VITALS — BP 118/81 | HR 76 | Resp 16 | Ht 62.0 in | Wt 179.6 lb

## 2023-05-24 DIAGNOSIS — E6609 Other obesity due to excess calories: Secondary | ICD-10-CM

## 2023-05-24 DIAGNOSIS — F17200 Nicotine dependence, unspecified, uncomplicated: Secondary | ICD-10-CM | POA: Diagnosis not present

## 2023-05-24 DIAGNOSIS — F32A Depression, unspecified: Secondary | ICD-10-CM

## 2023-05-24 DIAGNOSIS — Z823 Family history of stroke: Secondary | ICD-10-CM

## 2023-05-24 DIAGNOSIS — E559 Vitamin D deficiency, unspecified: Secondary | ICD-10-CM | POA: Diagnosis not present

## 2023-05-24 DIAGNOSIS — R7303 Prediabetes: Secondary | ICD-10-CM | POA: Diagnosis not present

## 2023-05-24 DIAGNOSIS — R0602 Shortness of breath: Secondary | ICD-10-CM

## 2023-05-24 DIAGNOSIS — Z0000 Encounter for general adult medical examination without abnormal findings: Secondary | ICD-10-CM

## 2023-05-24 DIAGNOSIS — Z1159 Encounter for screening for other viral diseases: Secondary | ICD-10-CM

## 2023-05-24 DIAGNOSIS — Z6832 Body mass index (BMI) 32.0-32.9, adult: Secondary | ICD-10-CM

## 2023-05-24 MED ORDER — NICOTINE 21 MG/24HR TD PT24
21.0000 mg | MEDICATED_PATCH | Freq: Every day | TRANSDERMAL | 0 refills | Status: DC
Start: 2023-05-24 — End: 2023-09-16

## 2023-05-24 MED ORDER — NICOTINE 14 MG/24HR TD PT24
14.0000 mg | MEDICATED_PATCH | Freq: Every day | TRANSDERMAL | 0 refills | Status: DC
Start: 2023-05-24 — End: 2023-09-16

## 2023-05-24 MED ORDER — NICOTINE 7 MG/24HR TD PT24
7.0000 mg | MEDICATED_PATCH | Freq: Every day | TRANSDERMAL | 0 refills | Status: DC
Start: 2023-05-24 — End: 2023-09-16

## 2023-05-24 NOTE — Patient Instructions (Signed)
Calorie Counting for Weight Loss Calories are units of energy. Your body needs a certain number of calories from food to keep going throughout the day. When you eat or drink more calories than your body needs, your body stores the extra calories mostly as fat. When you eat or drink fewer calories than your body needs, your body burns fat to get the energy it needs. Calorie counting means keeping track of how many calories you eat and drink each day. Calorie counting can be helpful if you need to lose weight. If you eat fewer calories than your body needs, you should lose weight. Ask your health care provider what a healthy weight is for you. For calorie counting to work, you will need to eat the right number of calories each day to lose a healthy amount of weight per week. A dietitian can help you figure out how many calories you need in a day and will suggest ways to reach your calorie goal. A healthy amount of weight to lose each week is usually 1-2 lb (0.5-0.9 kg). This usually means that your daily calorie intake should be reduced by 500-750 calories. Eating 1,200-1,500 calories a day can help most women lose weight. Eating 1,500-1,800 calories a day can help most men lose weight. What do I need to know about calorie counting? Work with your health care provider or dietitian to determine how many calories you should get each day. To meet your daily calorie goal, you will need to: Find out how many calories are in each food that you would like to eat. Try to do this before you eat. Decide how much of the food you plan to eat. Keep a food log. Do this by writing down what you ate and how many calories it had. To successfully lose weight, it is important to balance calorie counting with a healthy lifestyle that includes regular activity. Where do I find calorie information?  The number of calories in a food can be found on a Nutrition Facts label. If a food does not have a Nutrition Facts label, try  to look up the calories online or ask your dietitian for help. Remember that calories are listed per serving. If you choose to have more than one serving of a food, you will have to multiply the calories per serving by the number of servings you plan to eat. For example, the label on a package of bread might say that a serving size is 1 slice and that there are 90 calories in a serving. If you eat 1 slice, you will have eaten 90 calories. If you eat 2 slices, you will have eaten 180 calories. How do I keep a food log? After each time that you eat, record the following in your food log as soon as possible: What you ate. Be sure to include toppings, sauces, and other extras on the food. How much you ate. This can be measured in cups, ounces, or number of items. How many calories were in each food and drink. The total number of calories in the food you ate. Keep your food log near you, such as in a pocket-sized notebook or on an app or website on your mobile phone. Some programs will calculate calories for you and show you how many calories you have left to meet your daily goal. What are some portion-control tips? Know how many calories are in a serving. This will help you know how many servings you can have of a certain   food. Use a measuring cup to measure serving sizes. You could also try weighing out portions on a kitchen scale. With time, you will be able to estimate serving sizes for some foods. Take time to put servings of different foods on your favorite plates or in your favorite bowls and cups so you know what a serving looks like. Try not to eat straight from a food's packaging, such as from a bag or box. Eating straight from the package makes it hard to see how much you are eating and can lead to overeating. Put the amount you would like to eat in a cup or on a plate to make sure you are eating the right portion. Use smaller plates, glasses, and bowls for smaller portions and to prevent  overeating. Try not to multitask. For example, avoid watching TV or using your computer while eating. If it is time to eat, sit down at a table and enjoy your food. This will help you recognize when you are full. It will also help you be more mindful of what and how much you are eating. What are tips for following this plan? Reading food labels Check the calorie count compared with the serving size. The serving size may be smaller than what you are used to eating. Check the source of the calories. Try to choose foods that are high in protein, fiber, and vitamins, and low in saturated fat, trans fat, and sodium. Shopping Read nutrition labels while you shop. This will help you make healthy decisions about which foods to buy. Pay attention to nutrition labels for low-fat or fat-free foods. These foods sometimes have the same number of calories or more calories than the full-fat versions. They also often have added sugar, starch, or salt to make up for flavor that was removed with the fat. Make a grocery list of lower-calorie foods and stick to it. Cooking Try to cook your favorite foods in a healthier way. For example, try baking instead of frying. Use low-fat dairy products. Meal planning Use more fruits and vegetables. One-half of your plate should be fruits and vegetables. Include lean proteins, such as chicken, turkey, and fish. Lifestyle Each week, aim to do one of the following: 150 minutes of moderate exercise, such as walking. 75 minutes of vigorous exercise, such as running. General information Know how many calories are in the foods you eat most often. This will help you calculate calorie counts faster. Find a way of tracking calories that works for you. Get creative. Try different apps or programs if writing down calories does not work for you. What foods should I eat?  Eat nutritious foods. It is better to have a nutritious, high-calorie food, such as an avocado, than a food with  few nutrients, such as a bag of potato chips. Use your calories on foods and drinks that will fill you up and will not leave you hungry soon after eating. Examples of foods that fill you up are nuts and nut butters, vegetables, lean proteins, and high-fiber foods such as whole grains. High-fiber foods are foods with more than 5 g of fiber per serving. Pay attention to calories in drinks. Low-calorie drinks include water and unsweetened drinks. The items listed above may not be a complete list of foods and beverages you can eat. Contact a dietitian for more information. What foods should I limit? Limit foods or drinks that are not good sources of vitamins, minerals, or protein or that are high in unhealthy fats. These   include: Candy. Other sweets. Sodas, specialty coffee drinks, alcohol, and juice. The items listed above may not be a complete list of foods and beverages you should avoid. Contact a dietitian for more information. How do I count calories when eating out? Pay attention to portions. Often, portions are much larger when eating out. Try these tips to keep portions smaller: Consider sharing a meal instead of getting your own. If you get your own meal, eat only half of it. Before you start eating, ask for a container and put half of your meal into it. When available, consider ordering smaller portions from the menu instead of full portions. Pay attention to your food and drink choices. Knowing the way food is cooked and what is included with the meal can help you eat fewer calories. If calories are listed on the menu, choose the lower-calorie options. Choose dishes that include vegetables, fruits, whole grains, low-fat dairy products, and lean proteins. Choose items that are boiled, broiled, grilled, or steamed. Avoid items that are buttered, battered, fried, or served with cream sauce. Items labeled as crispy are usually fried, unless stated otherwise. Choose water, low-fat milk,  unsweetened iced tea, or other drinks without added sugar. If you want an alcoholic beverage, choose a lower-calorie option, such as a glass of wine or light beer. Ask for dressings, sauces, and syrups on the side. These are usually high in calories, so you should limit the amount you eat. If you want a salad, choose a garden salad and ask for grilled meats. Avoid extra toppings such as bacon, cheese, or fried items. Ask for the dressing on the side, or ask for olive oil and vinegar or lemon to use as dressing. Estimate how many servings of a food you are given. Knowing serving sizes will help you be aware of how much food you are eating at restaurants. Where to find more information Centers for Disease Control and Prevention: www.cdc.gov U.[place]. Department of Agriculture: myplate.gov Summary Calorie counting means keeping track of how many calories you eat and drink each day. If you eat fewer calories than your body needs, you should lose weight. A healthy amount of weight to lose per week is usually 1-2 lb (0.5-0.9 kg). This usually means reducing your daily calorie intake by 500-750 calories. The number of calories in a food can be found on a Nutrition Facts label. If a food does not have a Nutrition Facts label, try to look up the calories online or ask your dietitian for help. Use smaller plates, glasses, and bowls for smaller portions and to prevent overeating. Use your calories on foods and drinks that will fill you up and not leave you hungry shortly after a meal. This information is not intended to replace advice given to you by your health care provider. Make sure you discuss any questions you have with your health care provider. Document Revised: 10/26/2019 Document Reviewed: 10/26/2019 Elsevier Patient Education  2023 Elsevier Inc.  

## 2023-05-24 NOTE — Progress Notes (Signed)
Established Patient Office Visit  Subjective   Patient ID: Christina Ingram, female    DOB: Jun 16, 1996  Age: 27 y.o. MRN: [id number]  Annual visit    HPI  [name] is a 27 y.o. obese female here today for a annual visit. Patient has No headache, No chest pain, No abdominal pain - No Nausea, No new weakness tingling or numbness, No Cough -She did voice concerns with wanting to stop smoking. Last A1C prediabetes and family history of CVA, Denies polyuria, polydipsia, polyphasia or vision changes.Endorses shortness of breath related to smoking and allergies. No problems updated.   ROS Comprehensive ROS Pertinent positive and negative noted in HPI     Objective:    BP 118/81 (BP Location: Right Arm, Patient Position: Sitting, Cuff Size: Normal)   Pulse 76   Resp 16   Ht 5\' 2"  (1.575 m)   Wt 179 lb 9.6 oz (81.5 kg)   SpO2 100%   BMI 32.85 kg/m  BP Readings from Last 3 Encounters:  05/24/23 118/81  03/12/23 117/64  03/12/23 119/87    Physical Exam Exam General appearance : Awake, alert, not in any distress. Speech Clear. Not toxic looking HEENT: Atraumatic and Normocephalic, pupils equally reactive to light and accomodation Neck: Supple, no JVD. No cervical lymphadenopathy.  Chest: Good air entry bilaterally, no added sounds  CVS: S1 S2 regular, no murmurs.  Abdomen: Bowel sounds present, Non tender and not distended with no gaurding, rigidity or rebound. Extremities: B/L Lower Ext shows no edema, both legs are warm to touch Neurology: Awake alert, and oriented X 3, CN II-XII intact, Non focal Skin: No Rash  No results found for any visits on 05/24/23.   The ASCVD Risk score (Arnett DK, et al., 2019) failed to calculate for the following reasons:   The 2019 ASCVD risk score is only valid for ages 65 to 83    Diagnoses and all orders for this visit: Annual physical exam   Tobacco use disorder 2/2 Shortness of breath  -     nicotine (NICODERM CQ) 21 mg/24hr  patch; Place 1 patch (21 mg total) onto the skin daily. -     nicotine (NICODERM CQ) 14 mg/24hr patch; Place 1 patch (14 mg total) onto the skin daily. -     nicotine (NICODERM CQ) 7 mg/24hr patch; Place 1 patch (7 mg total) onto the skin daily.  Class 1 obesity due to excess calories without serious comorbidity with body mass index (BMI) of 32.0 to 32.9 in adult 2/2 Shortness of breath  Obesity is 30-39 indicating an excess in caloric intake or underlining conditions. This may lead to other co-morbidities. Educated on lifestyle modifications of diet and exercise which may reduce obesity.    Prediabetes -     Hemoglobin A1c  Vitamin D deficiency -     Vitamin D, 25-hydroxy  Family history of CVA -     Lipid Panel  Encounter for HCV screening test for low risk patient -     HCV Ab w Reflex to Quant PCR  Depression, unspecified depression type  Refer to Clinical social worker      Patient have been counseled extensively about nutrition and exercise. Other issues discussed during this visit include: low cholesterol diet, weight control and daily exercise, foot care, annual eye examinations at Ophthalmology, importance of adherence with medications and regular follow-up. We also discussed long term complications of uncontrolled diabetes and hypertension.   Return in about 6 months (around  11/24/2023) for medical conditions.  The patient was given clear instructions to go to ER or return to medical center if symptoms don't improve, worsen or new problems develop. The patient verbalized understanding. The patient was told to call to get lab results if they haven't heard anything in the next week.   This note has been created with Education officer, environmental. Any transcriptional errors are unintentional.   Grayce Sessions, NP 05/24/2023, 9:24 AM

## 2023-05-25 LAB — LIPID PANEL
Chol/HDL Ratio: 4.3 ratio (ref 0.0–4.4)
Cholesterol, Total: 126 mg/dL (ref 100–199)
HDL: 29 mg/dL — ABNORMAL LOW (ref >39)
LDL Chol Calc (NIH): 78 mg/dL (ref 0–99)
Triglycerides: 100 mg/dL (ref 0–149)
VLDL Cholesterol Cal: 19 mg/dL (ref 5–40)

## 2023-05-25 LAB — HCV AB W REFLEX TO QUANT PCR: HCV Ab: NONREACTIVE

## 2023-05-25 LAB — HCV INTERPRETATION

## 2023-05-25 LAB — VITAMIN D 25 HYDROXY (VIT D DEFICIENCY, FRACTURES): Vit D, 25-Hydroxy: 21.2 ng/mL — ABNORMAL LOW (ref 30.0–100.0)

## 2023-05-25 LAB — HEMOGLOBIN A1C
Est. average glucose Bld gHb Est-mCnc: 114 mg/dL
Hgb A1c MFr Bld: 5.6 % (ref 4.8–5.6)

## 2023-06-03 ENCOUNTER — Telehealth (INDEPENDENT_AMBULATORY_CARE_PROVIDER_SITE_OTHER): Payer: Self-pay | Admitting: Licensed Clinical Social Worker

## 2023-06-03 NOTE — Telephone Encounter (Signed)
LCSWA called patient today to introduce herself and to assess patients' mental health needs. Patient was referred by PCP for depression. Pt stated that she is doing well and did not want to see anyone for anything at this time.

## 2023-09-09 ENCOUNTER — Encounter (HOSPITAL_COMMUNITY): Payer: Self-pay | Admitting: Emergency Medicine

## 2023-09-09 ENCOUNTER — Other Ambulatory Visit: Payer: Self-pay

## 2023-09-09 ENCOUNTER — Emergency Department (HOSPITAL_COMMUNITY)
Admission: EM | Admit: 2023-09-09 | Discharge: 2023-09-09 | Discharge status: Against Medical Advice | Payer: Medicaid Other | Attending: Medical | Admitting: Medical

## 2023-09-09 DIAGNOSIS — R519 Headache, unspecified: Secondary | ICD-10-CM | POA: Insufficient documentation

## 2023-09-09 DIAGNOSIS — W01198A Fall on same level from slipping, tripping and stumbling with subsequent striking against other object, initial encounter: Secondary | ICD-10-CM | POA: Insufficient documentation

## 2023-09-09 DIAGNOSIS — Z5321 Procedure and treatment not carried out due to patient leaving prior to being seen by health care provider: Secondary | ICD-10-CM | POA: Insufficient documentation

## 2023-09-09 NOTE — ED Notes (Signed)
Pt got up and walked out of triage without saying anything. Pt seen walking out of triage area.

## 2023-09-09 NOTE — ED Triage Notes (Signed)
Pt here for migraine that started today. Pt states "I have migraines". Reports a fall 12 days ago states she hit her head and passed out and had a seizure. Denies hx of seizures.

## 2023-09-09 NOTE — ED Notes (Signed)
I saw patient storm out of the lobby and leave

## 2023-09-09 NOTE — ED Provider Triage Note (Signed)
Emergency Medicine Provider Triage Evaluation Note  Radene Gunning , a 27 y.o. female  was evaluated in triage.  Pt complains of headachex12 days after fall and hit head. States "I have dents in my head now." No LOC. No BT. Reports persistent headache.   Review of Systems  Positive: headache Negative: N/V  Physical Exam  BP (!) 130/92 (BP Location: Right Arm)   Pulse 71   Temp 98.1 F (36.7 C) (Oral)   Resp 16   SpO2 100%  Gen:   Awake, no distress   Resp:  Normal effort  MSK:   Moves extremities without difficulty  Other:  Midline ttp of cervical spine, no neurodeficits  Medical Decision Making  Medically screening exam initiated at 3:05 PM.  Appropriate orders placed.  JULLIET MCCRANEY was informed that the remainder of the evaluation will be completed by another provider, this initial triage assessment does not replace that evaluation, and the importance of remaining in the ED until their evaluation is complete.    Pete Pelt, [place] 09/09/23 1506

## 2023-09-16 ENCOUNTER — Encounter (INDEPENDENT_AMBULATORY_CARE_PROVIDER_SITE_OTHER): Payer: Self-pay | Admitting: Primary Care

## 2023-09-16 ENCOUNTER — Ambulatory Visit (INDEPENDENT_AMBULATORY_CARE_PROVIDER_SITE_OTHER): Payer: Medicaid Other | Admitting: Primary Care

## 2023-09-16 VITALS — BP 112/74 | HR 79 | Resp 16 | Wt 177.8 lb

## 2023-09-16 DIAGNOSIS — R561 Post traumatic seizures: Secondary | ICD-10-CM | POA: Diagnosis not present

## 2023-09-16 DIAGNOSIS — Z2821 Immunization not carried out because of patient refusal: Secondary | ICD-10-CM

## 2023-09-16 DIAGNOSIS — W19XXXA Unspecified fall, initial encounter: Secondary | ICD-10-CM | POA: Diagnosis not present

## 2023-09-16 NOTE — Progress Notes (Signed)
Subjective:   [name] is a 27 y.o. female presents for a ongoing headache and back pain since her fall at [facility] on August 28, 2023.  Patient was not aware but was told by customers that she had a seizure patient is unable to recall this situation due to syncope.  Patient has never had seizures or diagnosis of seizures in her lifetime.  Noted that the 14th of this month during her sleep she had a seizure unaware her fianc woke her up she bit her tongue and was drooling from her mouth.  She did not notify any staff at that time thinking she was going to be okay 3 weeks later she still has the same problem.  Advised patient to return to [facility] to make them aware and the reason why I she did not do it that day she was ill went to purch[employer] to purchasee OTC headache medication and left.  Patient went to the emergency room for weight and staff asking the same questions as though she did not know where she was or what she was doing.  Therefore she got frustrated and left.  Today she rates her headache as a 10 out of 10 and her back.  10 out of 10.  Location of headache is frontal and cervical, thoracic and lumbar.   She has not taking any medication for the pain. Past Medical History:  Diagnosis Date   Hypertension      Allergies  Allergen Reactions   Oxycodone Other (See Comments)    dizziness   Penicillins Rash    Has patient had a PCN reaction causing immediate rash, facial/tongue/throat swelling, SOB or lightheadedness with hypotension: Yes Has patient had a PCN reaction causing severe rash involving mucus membranes or skin necrosis: No Has patient had a PCN reaction that required hospitalization No Has patient had a PCN reaction occurring within the last 10 years: Yes If all of the above answers are "NO", then may proceed with Cephalosporin use.     Current Outpatient Medications on File Prior to Visit  Medication Sig Dispense Refill   etodolac (LODINE) 500 MG tablet Take 1  tablet (500 mg total) by mouth 2 (two) times daily with a meal. As needed for pain 14 tablet 0   hydrOXYzine (ATARAX) 25 MG tablet Take 1 tablet (25 mg total) by mouth every 8 (eight) hours as needed for up to 21 doses. 21 tablet 0   methocarbamol (ROBAXIN) 500 MG tablet Take 1 tablet (500 mg total) by mouth 2 (two) times daily. As needed; sedation precaution 20 tablet 0   No current facility-administered medications on file prior to visit.    Review of System: Comprehensive ROS Pertinent positive and negative noted in HPI   Objective:  BP 112/74   Pulse 79   Resp 16   Wt 177 lb 12.8 oz (80.6 kg)   LMP 09/14/2023   SpO2 96%   BMI 32.52 kg/m   Filed Weights   09/16/23 1509  Weight: 177 lb 12.8 oz (80.6 kg)   Physical Exam Vitals reviewed.  HENT:     Head: Normocephalic.     Comments: During exam checking frontal sinuses elicit pain from temple to temple    Right Ear: Tympanic membrane, ear canal and external ear normal.     Left Ear: Tympanic membrane, ear canal and external ear normal.     Nose: Nose normal.     Mouth/Throat:     Mouth: Mucous membranes  are moist.  Eyes:     Extraocular Movements: Extraocular movements intact.     Pupils: Pupils are equal, round, and reactive to light.  Cardiovascular:     Rate and Rhythm: Normal rate.  Pulmonary:     Effort: Pulmonary effort is normal.     Breath sounds: Normal breath sounds.  Abdominal:     General: Bowel sounds are normal.     Palpations: Abdomen is soft.  Musculoskeletal:        General: Normal range of motion.     Cervical back: Normal range of motion.  Skin:    General: Skin is warm and dry.  Neurological:     Mental Status: She is alert and oriented to person, place, and time.  Psychiatric:        Mood and Affect: Mood normal.        Behavior: Behavior normal.        Thought Content: Thought content normal.      Assessment:  Letetia was seen today for hospitalization follow-up, panic attack, anxiety  and depression.  Diagnoses and all orders for this visit:  Seizure after head injury [facility]) -     Ambulatory referral to Neurology  Tetanus, diphtheria, and acellular pertussis (Tdap) vaccination declined  Fall, initial encounter -     Ambulatory referral to Neurology    This note has been created with Education officer, environmental. Any transcriptional errors are unintentional.   Return in about 2 months (around 11/17/2023) for medical conditions.  Grayce Sessions, NP 09/16/2023, 3:55 PM

## 2023-11-10 ENCOUNTER — Telehealth (INDEPENDENT_AMBULATORY_CARE_PROVIDER_SITE_OTHER): Payer: Self-pay | Admitting: Primary Care

## 2023-11-10 NOTE — Telephone Encounter (Signed)
Called pt to remind them about atp. VM left with pt.

## 2023-11-16 ENCOUNTER — Telehealth (INDEPENDENT_AMBULATORY_CARE_PROVIDER_SITE_OTHER): Payer: Self-pay | Admitting: Primary Care

## 2023-11-16 NOTE — Telephone Encounter (Signed)
Called to make atp virtual. Pt agree to make atp virtual.

## 2023-11-17 ENCOUNTER — Telehealth (INDEPENDENT_AMBULATORY_CARE_PROVIDER_SITE_OTHER): Payer: Medicaid Other | Admitting: Primary Care

## 2023-11-24 ENCOUNTER — Ambulatory Visit (INDEPENDENT_AMBULATORY_CARE_PROVIDER_SITE_OTHER): Payer: Self-pay | Admitting: Primary Care

## 2023-11-24 ENCOUNTER — Encounter (INDEPENDENT_AMBULATORY_CARE_PROVIDER_SITE_OTHER): Payer: Self-pay

## 2024-01-13 ENCOUNTER — Emergency Department (HOSPITAL_COMMUNITY)
Admission: EM | Admit: 2024-01-13 | Discharge: 2024-01-14 | Disposition: A | Discharge status: Home / Self Care | Attending: Emergency Medicine | Admitting: Emergency Medicine

## 2024-01-13 ENCOUNTER — Encounter (HOSPITAL_COMMUNITY): Payer: Self-pay | Admitting: *Deleted

## 2024-01-13 ENCOUNTER — Other Ambulatory Visit: Payer: Self-pay

## 2024-01-13 DIAGNOSIS — F29 Unspecified psychosis not due to a substance or known physiological condition: Secondary | ICD-10-CM | POA: Diagnosis present

## 2024-01-13 DIAGNOSIS — I10 Essential (primary) hypertension: Secondary | ICD-10-CM | POA: Diagnosis not present

## 2024-01-13 LAB — CBC WITH DIFFERENTIAL/PLATELET
Abs Immature Granulocytes: 0.01 10*3/uL (ref 0.00–0.07)
Basophils Absolute: 0 10*3/uL (ref 0.0–0.1)
Basophils Relative: 0 %
Eosinophils Absolute: 0.1 10*3/uL (ref 0.0–0.5)
Eosinophils Relative: 2 %
HCT: 36.4 % (ref 36.0–46.0)
Hemoglobin: 11.5 g/dL — ABNORMAL LOW (ref 12.0–15.0)
Immature Granulocytes: 0 %
Lymphocytes Relative: 23 %
Lymphs Abs: 1.4 10*3/uL (ref 0.7–4.0)
MCH: 29.1 pg (ref 26.0–34.0)
MCHC: 31.6 g/dL (ref 30.0–36.0)
MCV: 92.2 fL (ref 80.0–100.0)
Monocytes Absolute: 0.5 10*3/uL (ref 0.1–1.0)
Monocytes Relative: 8 %
Neutro Abs: 3.9 10*3/uL (ref 1.7–7.7)
Neutrophils Relative %: 67 %
Platelets: 250 10*3/uL (ref 150–400)
RBC: 3.95 MIL/uL (ref 3.87–5.11)
RDW: 12.5 % (ref 11.5–15.5)
WBC: 5.9 10*3/uL (ref 4.0–10.5)
nRBC: 0 % (ref 0.0–0.2)

## 2024-01-13 LAB — COMPREHENSIVE METABOLIC PANEL WITH GFR
ALT: 13 U/L (ref 0–44)
AST: 18 U/L (ref 15–41)
Albumin: 3.2 g/dL — ABNORMAL LOW (ref 3.5–5.0)
Alkaline Phosphatase: 37 U/L — ABNORMAL LOW (ref 38–126)
Anion gap: 6 (ref 5–15)
BUN: 7 mg/dL (ref 6–20)
CO2: 26 mmol/L (ref 22–32)
Calcium: 8.7 mg/dL — ABNORMAL LOW (ref 8.9–10.3)
Chloride: 104 mmol/L (ref 98–111)
Creatinine, Ser: 0.8 mg/dL (ref 0.44–1.00)
GFR, Estimated: >60 mL/min (ref >60)
Glucose, Bld: 110 mg/dL — ABNORMAL HIGH (ref 70–99)
Potassium: 3.6 mmol/L (ref 3.5–5.1)
Sodium: 136 mmol/L (ref 135–145)
Total Bilirubin: 0.4 mg/dL (ref 0.0–1.2)
Total Protein: 5.6 g/dL — ABNORMAL LOW (ref 6.5–8.1)

## 2024-01-13 LAB — ETHANOL: Alcohol, Ethyl (B): <10 mg/dL (ref <10)

## 2024-01-13 LAB — HCG, QUANTITATIVE, PREGNANCY: hCG, Beta Chain, Quant, S: <1 m[IU]/mL (ref <5)

## 2024-01-13 LAB — SALICYLATE LEVEL: Salicylate Lvl: <7 mg/dL — ABNORMAL LOW (ref 7.0–30.0)

## 2024-01-13 LAB — ACETAMINOPHEN LEVEL: Acetaminophen (Tylenol), Serum: <10 ug/mL — ABNORMAL LOW (ref 10–30)

## 2024-01-13 MED ORDER — LORAZEPAM 1 MG PO TABS: 1.0000 mg | ORAL_TABLET | Freq: Once | ORAL | Status: DC

## 2024-01-13 MED ORDER — IBUPROFEN 400 MG PO TABS: 600.0000 mg | ORAL_TABLET | Freq: Three times a day (TID) | ORAL | Status: DC | PRN

## 2024-01-13 MED ORDER — ALUM & MAG HYDROXIDE-SIMETH 200-200-20 MG/5ML PO SUSP: 30.0000 mL | Freq: Four times a day (QID) | ORAL | Status: DC | PRN

## 2024-01-13 MED ORDER — LORAZEPAM 1 MG PO TABS
1.0000 mg | ORAL_TABLET | Freq: Once | ORAL | Status: DC
  Filled 2024-01-13: qty 1

## 2024-01-13 MED ORDER — ACETAMINOPHEN 500 MG PO TABS
1000.0000 mg | ORAL_TABLET | Freq: Once | ORAL | Status: DC
  Filled 2024-01-13: qty 2

## 2024-01-13 MED ORDER — ONDANSETRON HCL 4 MG PO TABS: 4.0000 mg | ORAL_TABLET | Freq: Three times a day (TID) | ORAL | Status: DC | PRN

## 2024-01-13 NOTE — ED Triage Notes (Signed)
 Pt BIB [facility] with IVC papers-per papers pt with hx mental illness and ruled incompetent.  Per papers pt has been doing bizarre behavior .  Pt denies SI in triage but papers state she has made comments that she wished she was dead. She refuses to answer most questions in triage. IVC papers taken by pt's sister.  Contact # for sister Aloma Arrant [phone] and father Lafonda Piety [phone]

## 2024-01-13 NOTE — ED Notes (Signed)
 Security called and wanded pt in street clothes in triage.

## 2024-01-13 NOTE — BH Assessment (Signed)
 Patient was deferred to [facility] for a telepsych assessment. The assigned care coordinator will provide updates regarding the scheduling of the assessment. [facility] coordinator can be reached at [phone] for further information on the timing of the telepsych evaluation.

## 2024-01-13 NOTE — ED Notes (Signed)
 Pt changed into burgundy scrubs. Belongings bagged

## 2024-01-13 NOTE — ED Notes (Signed)
 1 bag of patients belongings placed in lockers.

## 2024-01-13 NOTE — ED Provider Notes (Signed)
 [place] EMERGENCY DEPARTMENT AT [facility] Provider Note   CSN: [id number] Arrival date & time: 01/13/24  1709     History  Chief Complaint  Patient presents with   V70.1    Christina Ingram is a 28 y.o. female with PMH as listed below who presents BIB [facility] with IVC papers-per papers pt with hx mental illness and ruled incompetent. Per papers pt has been doing bizarre behavior, such as jumping out of windows in the middle of the night and talking to people who aren't there. Pt denies SI in triage but papers state she has made comments that she wished she was dead. She refuses to answer most questions. IVC papers taken by pt's sister. Contact # for sister Aloma Arrant [phone] and father Lafonda Piety [phone].   Past Medical History:  Diagnosis Date   Hypertension        Home Medications Prior to Admission medications   Not on File      Allergies    Oxycodone  and Penicillins    Review of Systems   Review of Systems A 10 point review of systems was performed and is negative unless otherwise reported in HPI.  Physical Exam Updated Vital Signs BP 117/82   Pulse 71   Temp 98.1 F (36.7 C)   Resp 18   Ht 5\' 2"  (1.575 m)   Wt 80.6 kg   SpO2 100%   BMI 32.50 kg/m  Physical Exam General: Normal appearing female, lying in bed.  HEENT: PERRLA, Sclera anicteric, MMM, trachea midline.  Cardiology: RRR, no murmurs/rubs/gallops.  Resp: Normal respiratory rate and effort. CTAB, no wheezes, rhonchi, crackles.  Abd: Soft, non-tender, non-distended. No rebound tenderness or guarding.  GU: Deferred. MSK: No peripheral edema or signs of trauma. Extremities without deformity or TTP. No cyanosis or clubbing. Skin: warm, dry.  Neuro: A&Ox4, CNs II-XII grossly intact. MAEs. Sensation grossly intact.  Psych: Calm, intermittently cooperative. Poor eye contact. Minimal speaking.   ED Results / Procedures / Treatments   Labs (all labs ordered are listed, but  only abnormal results are displayed) Labs Reviewed  CBC WITH DIFFERENTIAL/PLATELET - Abnormal; Notable for the following components:      Result Value   Hemoglobin 11.5 (*)    All other components within normal limits  SALICYLATE LEVEL - Abnormal; Notable for the following components:   Salicylate Lvl <7.0 (*)    All other components within normal limits  ACETAMINOPHEN  LEVEL - Abnormal; Notable for the following components:   Acetaminophen  (Tylenol ), Serum <10 (*)    All other components within normal limits  URINALYSIS, ROUTINE W REFLEX MICROSCOPIC - Abnormal; Notable for the following components:   APPearance CLOUDY (*)    All other components within normal limits  COMPREHENSIVE METABOLIC PANEL WITH GFR - Abnormal; Notable for the following components:   Glucose, Bld 110 (*)    Calcium 8.7 (*)    Total Protein 5.6 (*)    Albumin 3.2 (*)    Alkaline Phosphatase 37 (*)    All other components within normal limits  HCG, QUANTITATIVE, PREGNANCY  RAPID URINE DRUG SCREEN, HOSP PERFORMED  ETHANOL    EKG EKG Interpretation Date/Time:  Friday January 14 2024 18:09:54 EDT Ventricular Rate:  62 PR Interval:  160 QRS Duration:  91 QT Interval:  392 QTC Calculation: 398 R Axis:   89  Text Interpretation: Sinus arrhythmia Borderline T abnormalities, anterior leads rate slower since previous Confirmed by Florette Hurry [phone]) on 01/15/2024  3:31:19 PM  Radiology No results found.  Procedures Procedures    Medications Ordered in ED Medications - No data to display  ED Course/ Medical Decision Making/ A&P                          Medical Decision Making Amount and/or Complexity of Data Reviewed Labs: ordered. Decision-making details documented in ED Course.  Risk OTC drugs. Prescription drug management.    This patient presents to the ED for concern of bizarre behavior and SI. IVC'd. This involves an extensive number of treatment options, and is a complaint that carries with  it a high risk of complications and morbidity.  I considered the following differential and admission for this acute, potentially life threatening condition.   MDM:    Considering, though less likely, homicidal ideation. Consider possible manic episode or underlying bipolar disease, psychotic episode or underlying schizophrenia, or substance-induced psychosis. Low c/f infectious process (no localizing symptoms to suggest urinary, pulmonary, or gastrointestinal infection), electrolyte abnormality, TBI, or CVA.   Clinical Course as of 01/22/24 1141  Thu Jan 13, 2024  1942 Patient pacing, trying to shut curtains, anxious/irritated. Will give 1 mg PO ativan . [HN]  2013 Hemoglobin(!): 11.5 Mildly decreased from 12-14 one month ago [HN]  2013 Comprehensive metabolic panel(!) Unremarkable in the context of this patient's presentation  [HN]  2013 Salicylate Lvl(!): <7.0 [HN]  2013 Alcohol, Ethyl (B): <10 [HN]  2013 Acetaminophen  (Tylenol ), S(!): <10 [HN]  2122 HCG, Beta Chain, Quant, S: <1 [HN]    Clinical Course User Index [HN] Merdis Stalling, MD    Labs: I Ordered, and personally interpreted labs.  The pertinent results include: Those listed above  Additional history obtained from chart review.    Reevaluation: After the interventions noted above, I reevaluated the patient and found that they have :improved  Social Determinants of Health:  lives independently  Disposition: Medically cleared pending psych  Co morbidities that complicate the patient evaluation  Past Medical History:  Diagnosis Date   Hypertension      Medicines Meds ordered this encounter  Medications   DISCONTD: acetaminophen  (TYLENOL ) tablet 1,000 mg   DISCONTD: ibuprofen  (ADVIL ) tablet 600 mg   DISCONTD: ondansetron  (ZOFRAN ) tablet 4 mg   DISCONTD: alum & mag hydroxide-simeth (MAALOX/MYLANTA) 200-200-20 MG/5ML suspension 30 mL   DISCONTD: LORazepam  (ATIVAN ) tablet 1 mg   DISCONTD: LORazepam  (ATIVAN )  tablet 1 mg   DISCONTD: ziprasidone  (GEODON ) injection 20 mg    I have reviewed the patients home medicines and have made adjustments as needed  Problem List / ED Course: Problem List Items Addressed This Visit       Other   Psychosis (HCC) - Primary                This note was created using dictation software, which may contain spelling or grammatical errors.    Merdis Stalling, MD 01/22/24 [phone]

## 2024-01-13 NOTE — ED Notes (Signed)
 Pt is pacing around the room, and shut the doors to the room and attempted to pull the curtain shut. She was directed she could not do that and she has now laid back in bed. She will not respond to questioning and attempts to provide care needs.

## 2024-01-14 ENCOUNTER — Encounter (HOSPITAL_COMMUNITY): Payer: Self-pay | Admitting: Psychiatry

## 2024-01-14 DIAGNOSIS — F209 Schizophrenia, unspecified: Secondary | ICD-10-CM | POA: Diagnosis not present

## 2024-01-14 DIAGNOSIS — F29 Unspecified psychosis not due to a substance or known physiological condition: Secondary | ICD-10-CM | POA: Diagnosis present

## 2024-01-14 LAB — RAPID URINE DRUG SCREEN, HOSP PERFORMED
Amphetamines: NOT DETECTED
Barbiturates: NOT DETECTED
Benzodiazepines: NOT DETECTED
Cocaine: NOT DETECTED
Opiates: NOT DETECTED
Tetrahydrocannabinol: NOT DETECTED

## 2024-01-14 LAB — URINALYSIS, ROUTINE W REFLEX MICROSCOPIC
Bilirubin Urine: NEGATIVE
Glucose, UA: NEGATIVE mg/dL
Hgb urine dipstick: NEGATIVE
Ketones, ur: NEGATIVE mg/dL
Leukocytes,Ua: NEGATIVE
Nitrite: NEGATIVE
Protein, ur: NEGATIVE mg/dL
Specific Gravity, Urine: 1.005 (ref 1.005–1.030)
pH: 6 (ref 5.0–8.0)

## 2024-01-14 MED ORDER — ZIPRASIDONE MESYLATE 20 MG IM SOLR: 20.0000 mg | Freq: Four times a day (QID) | INTRAMUSCULAR | Status: DC | PRN

## 2024-01-14 NOTE — ED Notes (Signed)
 Spoke with a representative at [facility], who will accept the patient. We will fax the IVC paper work to them now and then upload it to media in the near future.

## 2024-01-14 NOTE — ED Notes (Signed)
 Pt ambulated to the bathroom with law enforcement in NAD.

## 2024-01-14 NOTE — Progress Notes (Signed)
 Pt was referred to the following facilities:  Destination  Service Provider Address Phone Fax  [facility] [address], [place] [address] [phone]  [facility] [address], [place] [address] [phone]  [facility] [address] [place] [address] [place], [place] [address] [phone]  [facility] [address] [place], [place] [address] [phone]  [facility] [address] [place], [place] [address] [phone]  [facility] [address] [place] [address] [phone]  [facility] [address] [place] [address] [phone]  [facility] [address] [place] [address] [phone]  [facility] [address], [place] [address] [phone]  [facility] [address], [place] [address] [phone]  [facility] [address] [place] [address] [phone]  [facility] [address] [place], [place] [address] [phone]  Internal Comment last updated by Patt Boozer 01/14/2024 1531   Patt Boozer [phone]          Patt Boozer  [phone]  01/14/24

## 2024-01-14 NOTE — ED Notes (Signed)
 Pt is currently laying in bed, cover on top of her, does not appear to be in any distress at this time.

## 2024-01-14 NOTE — ED Notes (Signed)
 [employer] cuffed pt to bed via left wrist. Officer remains at bedside

## 2024-01-14 NOTE — ED Notes (Signed)
 Patient ate dinner

## 2024-01-14 NOTE — ED Notes (Signed)
 Patient ambulated to BR with shackles on feet and [facility], patient stated she would rather have shackles on ankles than on wrist so she could walk around.

## 2024-01-14 NOTE — ED Notes (Addendum)
 Patient wanted cuff back on wrist, so [facility] put cuff back on patient right wrist and took off ankle shackles.
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
## 2024-01-14 NOTE — ED Notes (Signed)
 Patient ate breakfast

## 2024-01-14 NOTE — ED Notes (Signed)
 Patient continues pacing in room. Sitter and Allied remain in view of patient.

## 2024-01-14 NOTE — ED Notes (Signed)
 Pt lying in bed with a blanket pulled over her. Equal rise and fall of chest noted. Will continue to monitor pt. Pt sitter aware we need urine sample.

## 2024-01-14 NOTE — ED Notes (Signed)
 Patient ambulatory to restroom and back to room. Appx 5 min later patient came back out stating she needed to use restroom again, patient then tried to walk down the hall passed the bathroom. Redirected by security to restroom. Patient now back on room standing at doorway

## 2024-01-14 NOTE — ED Notes (Signed)
 Pt attempted to walk out of back ED door. Pt had to be escorted back to her room by security. [employer], allied & security at bedside.
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
## 2024-01-14 NOTE — Progress Notes (Addendum)
 Anette Dragon, RN will be faxing IVC paperwork over to [facility] [phone]) and uploading to media.   Pt accepted to [facility] on 01/14/24   Pt meets inpatient criteria per: Rosaline SHAUNNA Bohr, NP   Attending Physician will be:  Millie Manners, MD   Report can be called to: [phone] (Option 2 and Leave Vmail)   Pt can arrive anytime today   Care Team Notified:  Rosaline SHAUNNA Bohr, NP, Anette Dragon Sabins, RN   Michial Skeen, [facility]

## 2024-01-14 NOTE — Progress Notes (Signed)
 [facility] is currently reviewing pt for placement.  Christina Ingram, [facility]

## 2024-01-14 NOTE — ED Notes (Signed)
 IVC PAPERWORK FAXED TO [facility] @ [phone] & [phone]
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
## 2024-01-14 NOTE — ED Notes (Signed)
 Patient ambulatory out of room and down hall to attempt to leave Emergency Department. (Further than previous attempts) Had to be redirected to turn back towards room. Sitter remained with patient.  Patient then preceded to walk by the direction of her room and had to be redirected again.  Clerk to call c-com to have officer sit at bedside to further prevent further elopement and maintain safety

## 2024-01-14 NOTE — ED Notes (Signed)
 PC from [place] for an update on patient. Reported that pt has been calm since my arrival. Ambulated to the bathroom and returned to her room without incident.

## 2024-01-14 NOTE — ED Provider Notes (Signed)
 Pt accepted at [facility], Dr Frosty Jews accepting. Continue IVC. She was medically cleared by prior team.    Teddi Favors, DO 01/14/24 1846

## 2024-01-14 NOTE — ED Notes (Addendum)
 Patient standing in door way staring at sitter and started pacing around and eloped out of back doors down hall to radiology, security able to physically redirect her back to room, patient staring at nurse and sitting then started walking out of room into hwy. [employer] called [employer] to come sit with her.

## 2024-01-14 NOTE — ED Notes (Signed)
 Cuff changed to right wrist

## 2024-02-16 ENCOUNTER — Telehealth (INDEPENDENT_AMBULATORY_CARE_PROVIDER_SITE_OTHER): Payer: Self-pay | Admitting: Primary Care

## 2024-02-16 NOTE — Telephone Encounter (Signed)
 Called pt to confirm appt. Pt will be present.

## 2024-02-17 ENCOUNTER — Encounter (INDEPENDENT_AMBULATORY_CARE_PROVIDER_SITE_OTHER): Payer: Self-pay | Admitting: Primary Care

## 2024-02-17 ENCOUNTER — Ambulatory Visit (INDEPENDENT_AMBULATORY_CARE_PROVIDER_SITE_OTHER): Admitting: Primary Care

## 2024-02-17 ENCOUNTER — Other Ambulatory Visit (HOSPITAL_COMMUNITY)
Admission: RE | Admit: 2024-02-17 | Discharge: 2024-02-17 | Disposition: A | Discharge status: Home / Self Care | Source: Ambulatory Visit | Attending: Primary Care | Admitting: Primary Care

## 2024-02-17 VITALS — BP 103/68 | HR 71 | Resp 16 | Wt 158.2 lb

## 2024-02-17 DIAGNOSIS — F32A Depression, unspecified: Secondary | ICD-10-CM | POA: Diagnosis not present

## 2024-02-17 DIAGNOSIS — Z113 Encounter for screening for infections with a predominantly sexual mode of transmission: Secondary | ICD-10-CM | POA: Diagnosis present

## 2024-02-17 DIAGNOSIS — R45851 Suicidal ideations: Secondary | ICD-10-CM

## 2024-02-17 DIAGNOSIS — N912 Amenorrhea, unspecified: Secondary | ICD-10-CM

## 2024-02-17 DIAGNOSIS — Z09 Encounter for follow-up examination after completed treatment for conditions other than malignant neoplasm: Secondary | ICD-10-CM | POA: Diagnosis not present

## 2024-02-17 NOTE — Progress Notes (Unsigned)
 Subjective:    [name] is a 28 y.o. female presents for hospital follow up  Admit date to the hospital was 01/13/24, brought in by the police for pt has been doing bizarre behavior, such as jumping out of windows in the middle of the night and talking to people who aren't there.  Patient was discharged from the hospital on 01/14/24, patient was admitted for: Psychosis, unspecified psychosis type.  Today she concerns about being tested for sexually transmitted diseases and pregnancy test. LMP 01/13/24 concerned 5 days late   Past Medical History:  Diagnosis Date   Hypertension      Allergies  Allergen Reactions   Oxycodone  Other (See Comments)    Dizziness    Penicillins Rash    Immediate rash, facial/tongue/throat swelling, SOB or lightheadedness with hypotension     Current Outpatient Medications on File Prior to Visit  Medication Sig Dispense Refill   benztropine (COGENTIN) 0.5 MG tablet Take 0.5 mg by mouth 2 (two) times daily.     divalproex (DEPAKOTE) 500 MG DR tablet Take 500 mg by mouth 2 (two) times daily.     No current facility-administered medications on file prior to visit.    Review of System: ROS Comprehensive ROS Pertinent positive and negative noted in HPI   Objective:  BP 103/68   Pulse 71   Resp 16   Wt 158 lb 3.2 oz (71.8 kg)   LMP 02/12/2024   SpO2 100%   BMI 28.94 kg/m   Filed Weights   02/17/24 1137  Weight: 158 lb 3.2 oz (71.8 kg)    Physical Exam Vitals reviewed.  Constitutional:      Appearance: Normal appearance.  HENT:     Head: Normocephalic.     Right Ear: Tympanic membrane, ear canal and external ear normal.     Left Ear: Tympanic membrane, ear canal and external ear normal.     Nose: Nose normal.     Mouth/Throat:     Mouth: Mucous membranes are moist.  Eyes:     Extraocular Movements: Extraocular movements intact.     Pupils: Pupils are equal, round, and reactive to light.  Cardiovascular:     Rate and Rhythm:  Normal rate.  Pulmonary:     Effort: Pulmonary effort is normal.     Breath sounds: Normal breath sounds.  Abdominal:     General: Bowel sounds are normal.     Palpations: Abdomen is soft.  Musculoskeletal:        General: Normal range of motion.     Cervical back: Normal range of motion.  Skin:    General: Skin is warm and dry.  Neurological:     Mental Status: She is alert and oriented to person, place, and time.  Psychiatric:        Mood and Affect: Mood normal.        Behavior: Behavior normal.        Thought Content: Thought content normal.     Assessment:   [name] was seen today for hospitalization follow-up.  Diagnoses and all orders for this visit:  Hospital discharge follow-up Patient was discharged from [facility] with enough medication for her appointment with PCP.  Plan in was supposed to be going to [facility] she is on is Cogentin and Depakote which is not prescribed by her PCP.  The Cogentin she has 2 pills and a half and Depakote is a 1/4 filled.  Called and spoke with  clinical case manager with this situation.  Also provided her with walk-in clinic services for [facility] behavioral health.  Asked patient to go into the walk-in clinic before she runs out of medication.   Screening for STD (sexually transmitted disease) Cervical ancillary  Suicidal ideations 2/2 Depression, unspecified depression type Denies suicidal thoughts of harming self or others at this visit Will be followed by psych patient is supposed to go to [facility] for continued treatment in [facility]   Amenorrhea  LMP 01/13/24 concerned 5 days late   PCOT pregnancy test   After spending time with the clinical nurse manager PCP made aware of her sister being in the car.  Walked out to the car asked patient if I could speak with her sister she agree.  Sister made writer aware she has been trying to take care of her and dealing with her and her mental issues all  this time.  Only question asked was she going to be able to take her to [place] tomorrow in [place] in [place] she stated yes. This note has been created with Education officer, environmental. Any transcriptional errors are unintentional.   No follow-ups on file.  Marius Siemens, NP 02/17/2024, 12:21 PM

## 2024-02-22 ENCOUNTER — Other Ambulatory Visit (INDEPENDENT_AMBULATORY_CARE_PROVIDER_SITE_OTHER): Payer: Self-pay | Admitting: Primary Care

## 2024-02-22 ENCOUNTER — Ambulatory Visit (INDEPENDENT_AMBULATORY_CARE_PROVIDER_SITE_OTHER): Payer: Self-pay | Admitting: Primary Care

## 2024-02-22 DIAGNOSIS — N760 Acute vaginitis: Secondary | ICD-10-CM

## 2024-02-22 LAB — CERVICOVAGINAL ANCILLARY ONLY
Bacterial Vaginitis (gardnerella): POSITIVE — AB
Candida Glabrata: NEGATIVE
Candida Vaginitis: NEGATIVE
Chlamydia: NEGATIVE
Comment: NEGATIVE
Comment: NEGATIVE
Comment: NEGATIVE
Comment: NEGATIVE
Comment: NEGATIVE
Comment: NORMAL
Neisseria Gonorrhea: NEGATIVE
Trichomonas: NEGATIVE

## 2024-02-22 MED ORDER — METRONIDAZOLE 500 MG PO TABS: 500.0000 mg | ORAL_TABLET | Freq: Two times a day (BID) | ORAL | 0 refills | Status: AC

## 2024-03-03 ENCOUNTER — Encounter (INDEPENDENT_AMBULATORY_CARE_PROVIDER_SITE_OTHER): Payer: Self-pay
